# Patient Record
Sex: Female | Born: 1950 | Race: Black or African American | Hispanic: No | Marital: Married | Smoking: Former smoker
Health system: Southern US, Community
[De-identification: ages and names within clinical notes are randomized; demographics above are authoritative.]

## PROBLEM LIST (undated history)

## (undated) DIAGNOSIS — K5790 Diverticulosis of intestine, part unspecified, without perforation or abscess without bleeding: Secondary | ICD-10-CM

## (undated) DIAGNOSIS — I341 Nonrheumatic mitral (valve) prolapse: Secondary | ICD-10-CM

## (undated) DIAGNOSIS — E785 Hyperlipidemia, unspecified: Secondary | ICD-10-CM

## (undated) DIAGNOSIS — E669 Obesity, unspecified: Secondary | ICD-10-CM

## (undated) HISTORY — PX: TONSILLECTOMY: SUR1361

## (undated) HISTORY — DX: Obesity, unspecified: E66.9

## (undated) HISTORY — DX: Nonrheumatic mitral (valve) prolapse: I34.1

## (undated) HISTORY — DX: Hyperlipidemia, unspecified: E78.5

## (undated) HISTORY — DX: Diverticulosis of intestine, part unspecified, without perforation or abscess without bleeding: K57.90

---
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
## 1992-09-24 HISTORY — PX: CHOLECYSTECTOMY: SHX55

## 1998-11-23 ENCOUNTER — Other Ambulatory Visit: Admission: RE | Admit: 1998-11-23 | Discharge: 1998-11-23 | Payer: Self-pay | Admitting: Obstetrics and Gynecology

## 1998-11-29 ENCOUNTER — Encounter: Payer: Self-pay | Admitting: Emergency Medicine

## 1998-11-29 ENCOUNTER — Emergency Department (HOSPITAL_COMMUNITY): Admission: EM | Admit: 1998-11-29 | Discharge: 1998-11-29 | Payer: Self-pay | Admitting: Emergency Medicine

## 1999-11-28 ENCOUNTER — Other Ambulatory Visit: Admission: RE | Admit: 1999-11-28 | Discharge: 1999-11-28 | Payer: Self-pay | Admitting: Obstetrics and Gynecology

## 2000-02-02 ENCOUNTER — Encounter: Payer: Self-pay | Admitting: Obstetrics and Gynecology

## 2000-02-02 ENCOUNTER — Encounter: Admission: RE | Admit: 2000-02-02 | Discharge: 2000-02-02 | Payer: Self-pay | Admitting: Obstetrics and Gynecology

## 2000-07-23 ENCOUNTER — Encounter (INDEPENDENT_AMBULATORY_CARE_PROVIDER_SITE_OTHER): Payer: Self-pay

## 2000-07-23 ENCOUNTER — Other Ambulatory Visit: Admission: RE | Admit: 2000-07-23 | Discharge: 2000-07-23 | Payer: Self-pay | Admitting: Gastroenterology

## 2000-11-29 ENCOUNTER — Other Ambulatory Visit: Admission: RE | Admit: 2000-11-29 | Discharge: 2000-11-29 | Payer: Self-pay | Admitting: Obstetrics and Gynecology

## 2001-05-20 ENCOUNTER — Encounter: Payer: Self-pay | Admitting: Orthopedic Surgery

## 2001-05-20 ENCOUNTER — Encounter: Admission: RE | Admit: 2001-05-20 | Discharge: 2001-05-20 | Payer: Self-pay | Admitting: Orthopedic Surgery

## 2003-05-24 ENCOUNTER — Encounter: Admission: RE | Admit: 2003-05-24 | Discharge: 2003-05-24 | Payer: Self-pay | Admitting: Internal Medicine

## 2003-11-03 ENCOUNTER — Emergency Department (HOSPITAL_COMMUNITY): Admission: EM | Admit: 2003-11-03 | Discharge: 2003-11-03 | Payer: Self-pay | Admitting: Emergency Medicine

## 2004-09-19 ENCOUNTER — Encounter: Admission: RE | Admit: 2004-09-19 | Discharge: 2004-09-19 | Payer: Self-pay | Admitting: Obstetrics and Gynecology

## 2004-12-14 ENCOUNTER — Encounter: Admission: RE | Admit: 2004-12-14 | Discharge: 2004-12-14 | Payer: Self-pay | Admitting: Orthopedic Surgery

## 2005-05-29 ENCOUNTER — Ambulatory Visit: Payer: Self-pay | Admitting: Internal Medicine

## 2006-03-28 ENCOUNTER — Encounter: Admission: RE | Admit: 2006-03-28 | Discharge: 2006-03-28 | Payer: Self-pay | Admitting: Internal Medicine

## 2007-04-08 ENCOUNTER — Encounter: Admission: RE | Admit: 2007-04-08 | Discharge: 2007-04-08 | Payer: Self-pay | Admitting: Internal Medicine

## 2007-04-17 ENCOUNTER — Ambulatory Visit: Payer: Self-pay | Admitting: Internal Medicine

## 2007-05-16 ENCOUNTER — Ambulatory Visit: Payer: Self-pay | Admitting: Internal Medicine

## 2007-05-16 LAB — CONVERTED CEMR LAB
Cholesterol: 192 mg/dL (ref 0–200)
LDL Cholesterol: 124 mg/dL — ABNORMAL HIGH (ref 0–99)
TSH: 3.16 microintl units/mL (ref 0.35–5.50)
Triglycerides: 87 mg/dL (ref 0–149)
VLDL: 17 mg/dL (ref 0–40)
Vit D, 1,25-Dihydroxy: 6 — ABNORMAL LOW (ref 20–57)

## 2007-06-11 ENCOUNTER — Ambulatory Visit: Payer: Self-pay | Admitting: Internal Medicine

## 2007-11-14 ENCOUNTER — Ambulatory Visit: Payer: Self-pay | Admitting: Internal Medicine

## 2007-11-14 ENCOUNTER — Ambulatory Visit (HOSPITAL_COMMUNITY): Admission: RE | Admit: 2007-11-14 | Discharge: 2007-11-14 | Payer: Self-pay | Admitting: Internal Medicine

## 2007-11-14 ENCOUNTER — Telehealth (INDEPENDENT_AMBULATORY_CARE_PROVIDER_SITE_OTHER): Payer: Self-pay | Admitting: *Deleted

## 2007-11-14 DIAGNOSIS — K5730 Diverticulosis of large intestine without perforation or abscess without bleeding: Secondary | ICD-10-CM | POA: Insufficient documentation

## 2007-11-14 DIAGNOSIS — E785 Hyperlipidemia, unspecified: Secondary | ICD-10-CM | POA: Insufficient documentation

## 2007-11-14 DIAGNOSIS — Z8679 Personal history of other diseases of the circulatory system: Secondary | ICD-10-CM | POA: Insufficient documentation

## 2007-11-25 ENCOUNTER — Telehealth (INDEPENDENT_AMBULATORY_CARE_PROVIDER_SITE_OTHER): Payer: Self-pay | Admitting: *Deleted

## 2007-11-25 LAB — CONVERTED CEMR LAB: Vit D, 1,25-Dihydroxy: 24 — ABNORMAL LOW (ref 30–89)

## 2007-11-26 ENCOUNTER — Encounter: Payer: Self-pay | Admitting: Internal Medicine

## 2007-12-05 ENCOUNTER — Ambulatory Visit: Payer: Self-pay | Admitting: Gastroenterology

## 2007-12-19 ENCOUNTER — Ambulatory Visit: Payer: Self-pay | Admitting: Gastroenterology

## 2008-04-12 ENCOUNTER — Encounter: Admission: RE | Admit: 2008-04-12 | Discharge: 2008-04-12 | Payer: Self-pay | Admitting: Internal Medicine

## 2008-09-24 HISTORY — PX: CARPAL TUNNEL RELEASE: SHX101

## 2008-12-23 ENCOUNTER — Ambulatory Visit: Payer: Self-pay | Admitting: Internal Medicine

## 2008-12-29 LAB — CONVERTED CEMR LAB
Basophils Relative: 0.9 % (ref 0.0–3.0)
CRP, High Sensitivity: 2 (ref 0.00–5.00)
Calcium: 9.2 mg/dL (ref 8.4–10.5)
Cholesterol: 182 mg/dL (ref 0–200)
HCT: 38.3 % (ref 36.0–46.0)
Hemoglobin: 12 g/dL (ref 12.0–15.0)
Lymphocytes Relative: 40 % (ref 12.0–46.0)
MCHC: 31.3 g/dL (ref 30.0–36.0)
Neutrophils Relative %: 45.4 % (ref 43.0–77.0)
Platelets: 205 10*3/uL (ref 150.0–400.0)
RBC: 5.25 M/uL — ABNORMAL HIGH (ref 3.87–5.11)
RDW: 13.9 % (ref 11.5–14.6)
Sodium: 139 meq/L (ref 135–145)
Triglycerides: 84 mg/dL (ref 0.0–149.0)
VLDL: 16.8 mg/dL (ref 0.0–40.0)
WBC: 3.8 10*3/uL — ABNORMAL LOW (ref 4.5–10.5)

## 2009-03-30 ENCOUNTER — Ambulatory Visit: Payer: Self-pay | Admitting: Internal Medicine

## 2009-03-30 DIAGNOSIS — D568 Other thalassemias: Secondary | ICD-10-CM | POA: Insufficient documentation

## 2009-03-30 DIAGNOSIS — E559 Vitamin D deficiency, unspecified: Secondary | ICD-10-CM

## 2009-03-31 ENCOUNTER — Encounter: Payer: Self-pay | Admitting: Internal Medicine

## 2009-04-05 LAB — CONVERTED CEMR LAB
Hgb A2 Quant: 2.2 % (ref 2.2–3.2)
Hgb A: 97.8 % (ref 96.8–97.8)
Hgb S Quant: 0 % (ref 0.0–0.0)
Vit D, 25-Hydroxy: 31 ng/mL (ref 30–89)

## 2009-04-27 ENCOUNTER — Encounter: Admission: RE | Admit: 2009-04-27 | Discharge: 2009-04-27 | Payer: Self-pay | Admitting: Internal Medicine

## 2009-07-05 ENCOUNTER — Ambulatory Visit: Payer: Self-pay | Admitting: Internal Medicine

## 2009-07-05 DIAGNOSIS — G5600 Carpal tunnel syndrome, unspecified upper limb: Secondary | ICD-10-CM

## 2009-07-06 ENCOUNTER — Encounter: Payer: Self-pay | Admitting: Internal Medicine

## 2009-07-07 ENCOUNTER — Telehealth (INDEPENDENT_AMBULATORY_CARE_PROVIDER_SITE_OTHER): Payer: Self-pay | Admitting: *Deleted

## 2009-07-08 LAB — CONVERTED CEMR LAB
Basophils Relative: 0.6 % (ref 0.0–3.0)
Eosinophils Relative: 1.9 % (ref 0.0–5.0)
HCT: 39.1 % (ref 36.0–46.0)
Lymphocytes Relative: 39.4 % (ref 12.0–46.0)
MCHC: 32.4 g/dL (ref 30.0–36.0)
Monocytes Absolute: 0.6 10*3/uL (ref 0.1–1.0)
Monocytes Relative: 15 % — ABNORMAL HIGH (ref 3.0–12.0)
Platelets: 232 10*3/uL (ref 150.0–400.0)
RBC: 5.34 M/uL — ABNORMAL HIGH (ref 3.87–5.11)
RDW: 13.8 % (ref 11.5–14.6)
Transferrin: 246.8 mg/dL (ref 212.0–360.0)

## 2009-07-21 ENCOUNTER — Ambulatory Visit (HOSPITAL_BASED_OUTPATIENT_CLINIC_OR_DEPARTMENT_OTHER): Admission: RE | Admit: 2009-07-21 | Discharge: 2009-07-21 | Payer: Self-pay | Admitting: Orthopedic Surgery

## 2009-07-29 ENCOUNTER — Encounter: Payer: Self-pay | Admitting: Internal Medicine

## 2009-08-31 ENCOUNTER — Encounter: Payer: Self-pay | Admitting: Internal Medicine

## 2010-04-18 ENCOUNTER — Ambulatory Visit: Payer: Self-pay | Admitting: Internal Medicine

## 2010-04-19 ENCOUNTER — Encounter: Payer: Self-pay | Admitting: Internal Medicine

## 2010-04-19 LAB — CONVERTED CEMR LAB
ALT: 22 units/L (ref 0–35)
AST: 22 units/L (ref 0–37)
Alkaline Phosphatase: 78 units/L (ref 39–117)
BUN: 9 mg/dL (ref 6–23)
Basophils Absolute: 0 10*3/uL (ref 0.0–0.1)
Basophils Relative: 0.4 % (ref 0.0–3.0)
Bilirubin Urine: NEGATIVE
CO2: 27 meq/L (ref 19–32)
Chloride: 101 meq/L (ref 96–112)
GFR calc non Af Amer: 102.81 mL/min (ref >60)
Glucose, Bld: 112 mg/dL — ABNORMAL HIGH (ref 70–99)
Ketones, ur: NEGATIVE mg/dL
LDL Cholesterol: 106 mg/dL — ABNORMAL HIGH (ref 0–99)
Leukocytes, UA: NEGATIVE
Lymphocytes Relative: 33.8 % (ref 12.0–46.0)
MCV: 72.6 fL — ABNORMAL LOW (ref 78.0–100.0)
Neutro Abs: 2.3 10*3/uL (ref 1.4–7.7)
Neutrophils Relative %: 49.3 % (ref 43.0–77.0)
Platelets: 246 10*3/uL (ref 150.0–400.0)
Potassium: 4.5 meq/L (ref 3.5–5.1)
Sodium: 136 meq/L (ref 135–145)
Total Bilirubin: 0.5 mg/dL (ref 0.3–1.2)
Total CHOL/HDL Ratio: 4
Triglycerides: 132 mg/dL (ref 0.0–149.0)
Urine Glucose: NEGATIVE mg/dL
Urobilinogen, UA: 0.2 (ref 0.0–1.0)
WBC: 4.7 10*3/uL (ref 4.5–10.5)

## 2010-04-20 LAB — CONVERTED CEMR LAB: Vit D, 25-Hydroxy: 22 ng/mL — ABNORMAL LOW (ref 30–89)

## 2010-05-01 ENCOUNTER — Encounter: Admission: RE | Admit: 2010-05-01 | Discharge: 2010-05-01 | Payer: Self-pay | Admitting: Internal Medicine

## 2010-05-01 IMAGING — MG MM DIGITAL SCREENING
4 series · 4 of 4 positions shown · non-contrast
Comparison: none

DG SCREEN MAMMOGRAM BILATERAL
Bilateral CC and MLO view(s) were taken.

DIGITAL SCREENING MAMMOGRAM WITH CAD:
The breast tissue is heterogeneously dense.  No masses or malignant type calcifications are 
identified.  Compared with prior studies.
Images were processed with CAD.

[R CC]
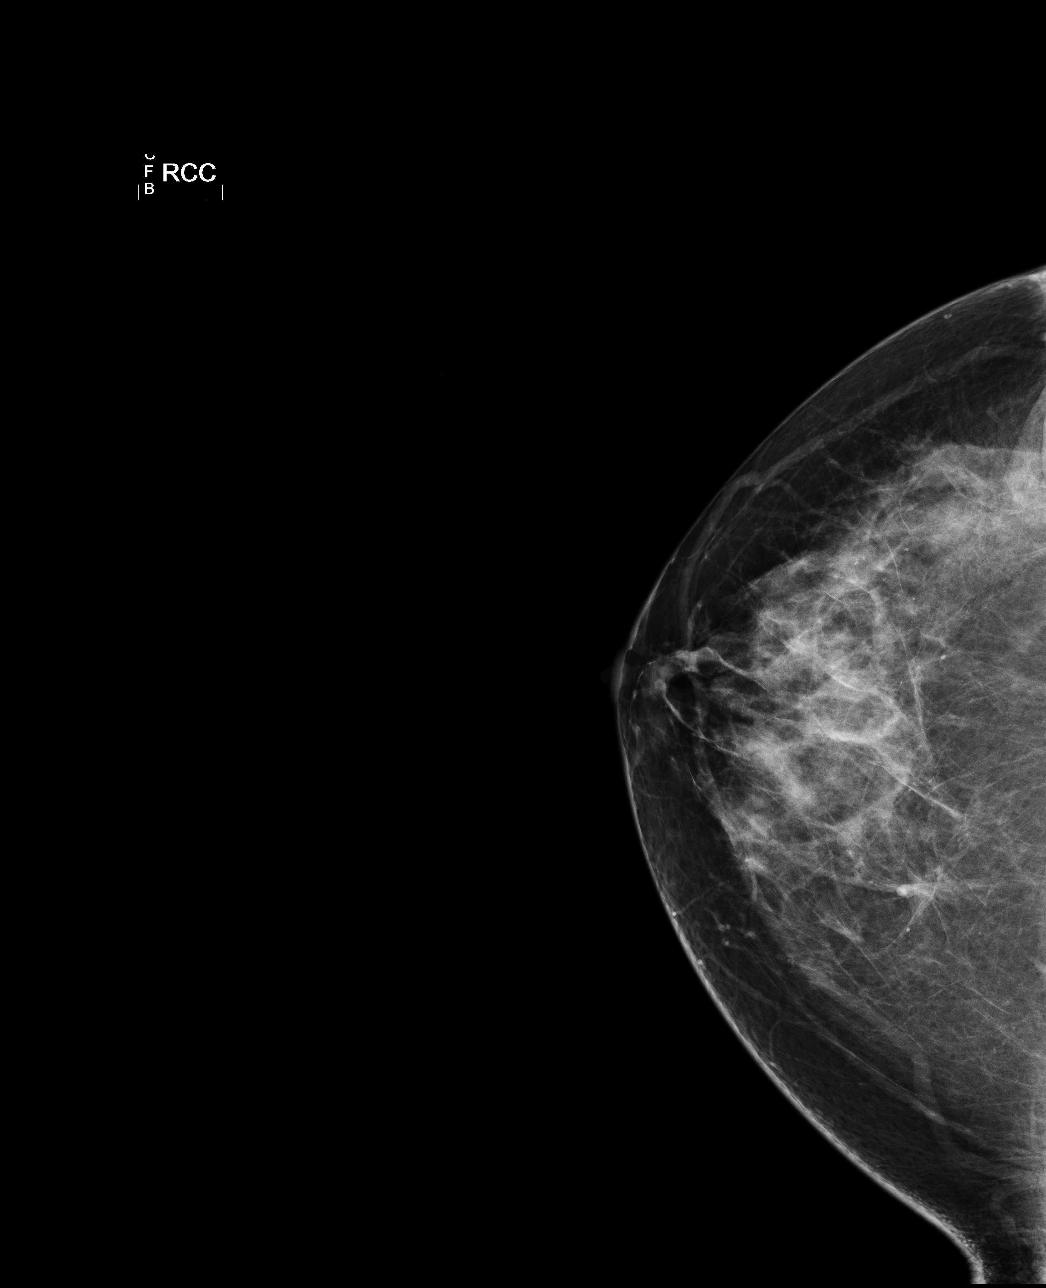

[L CC]
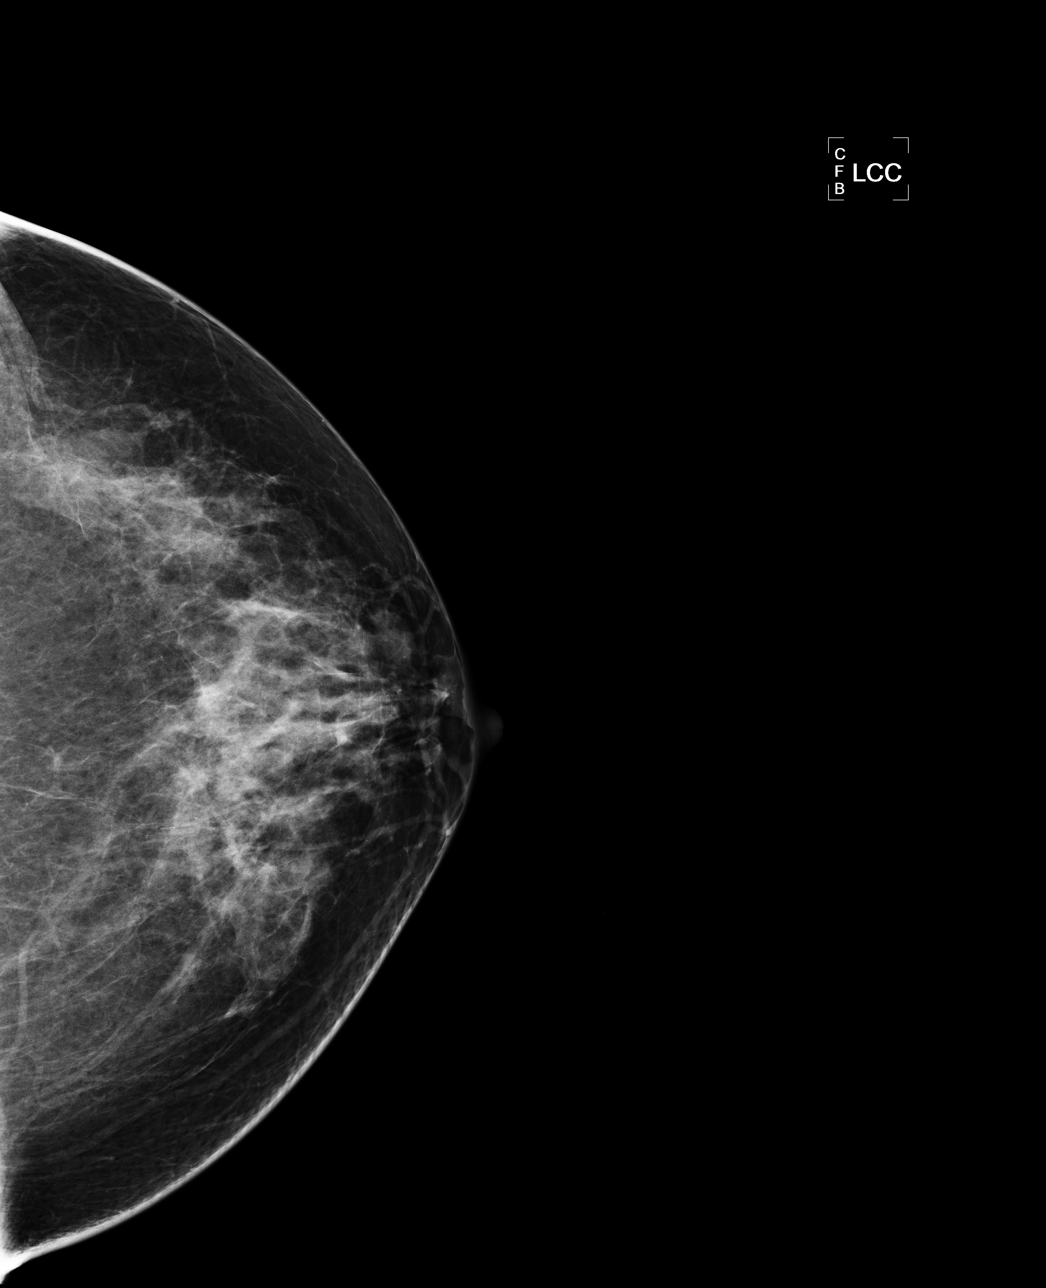

[L MLO]
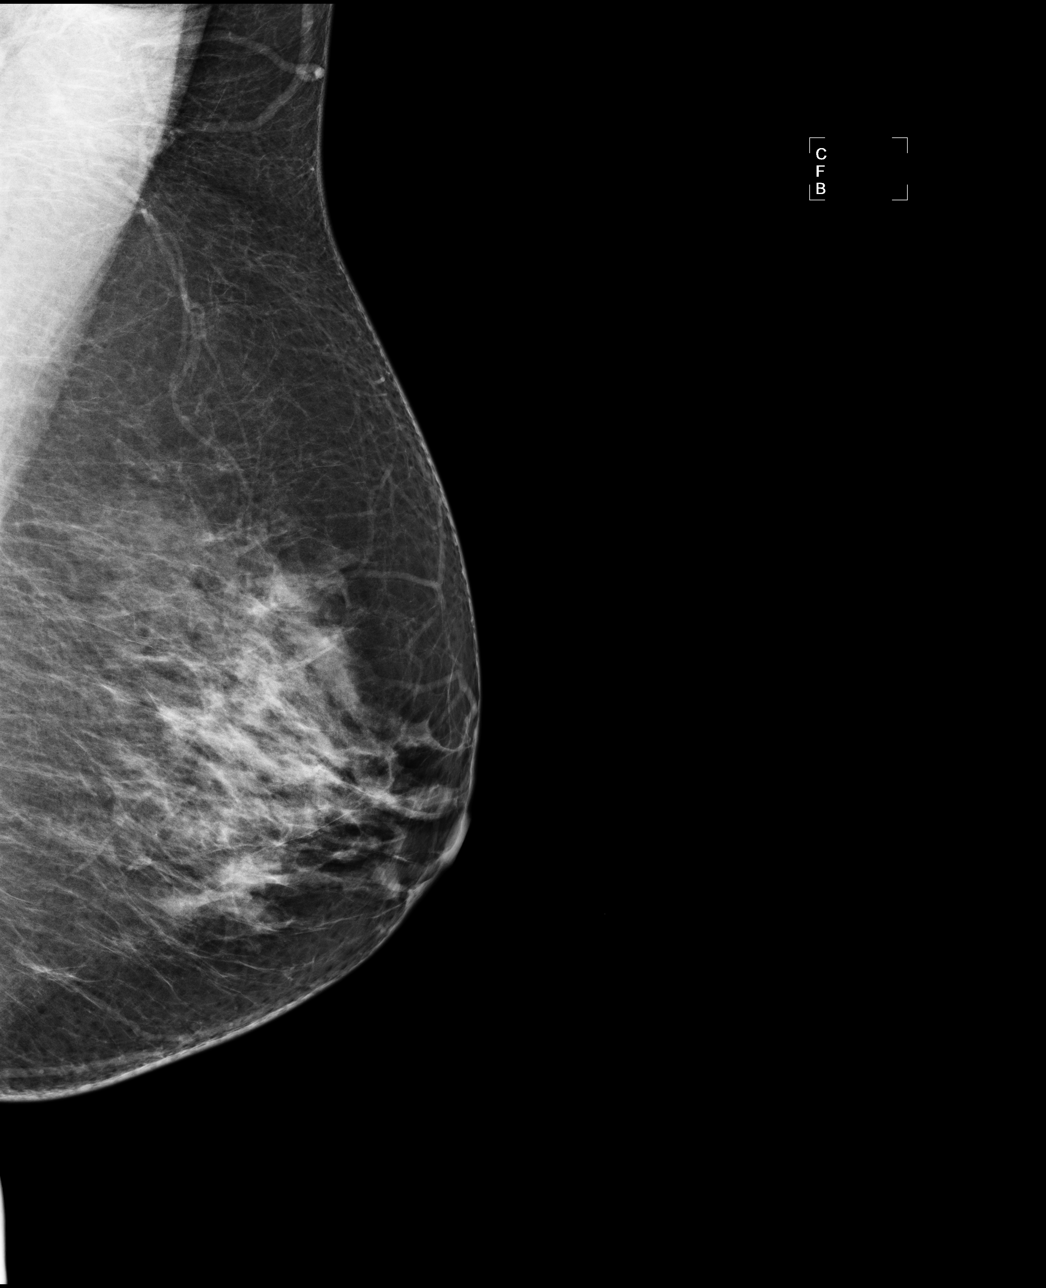

[R MLO]
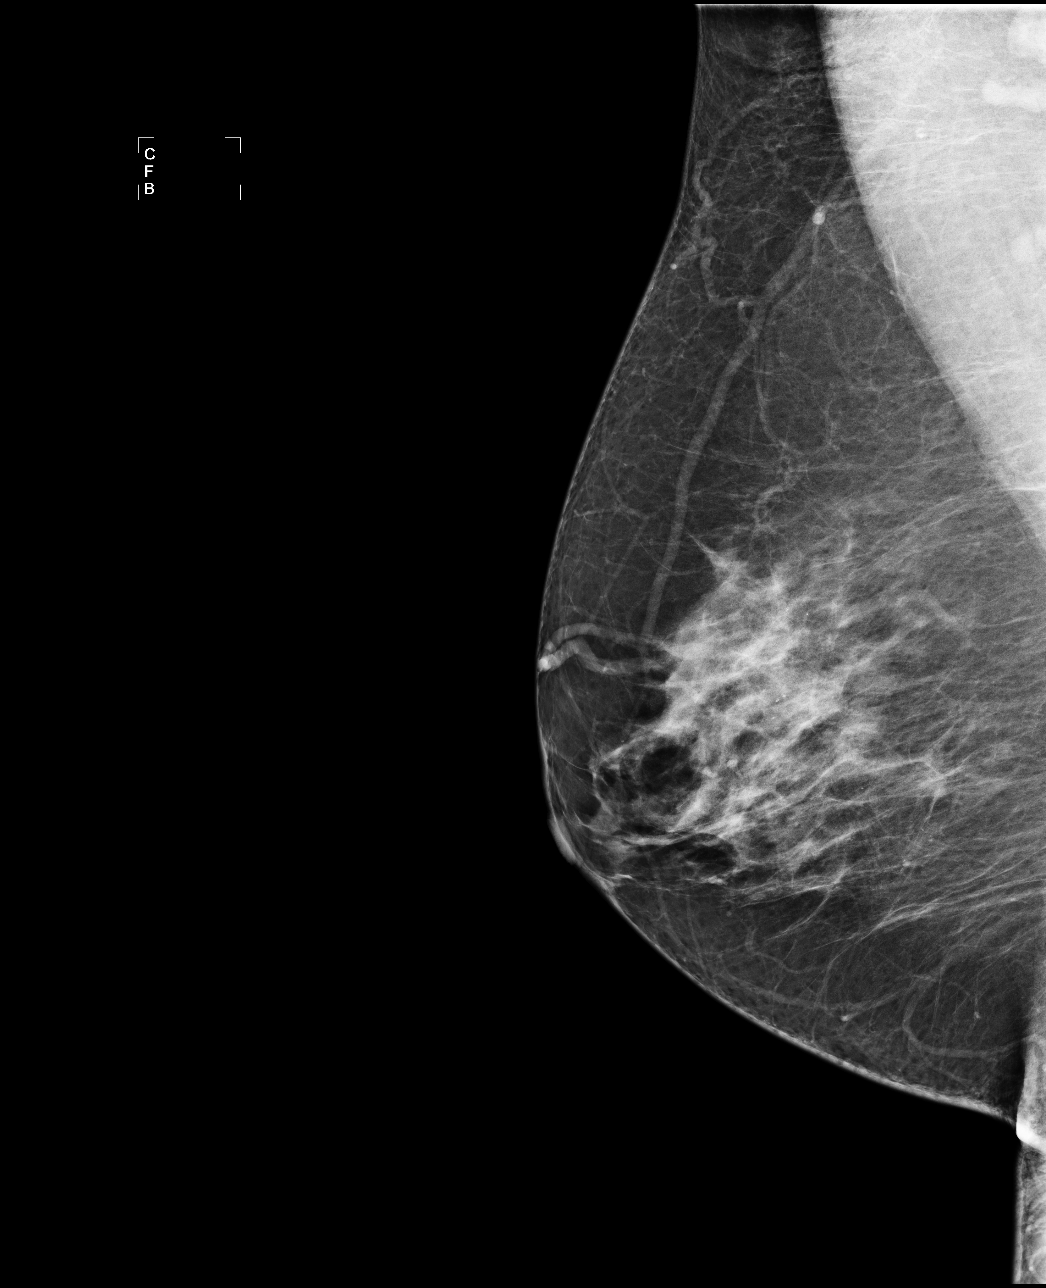

[4 of 4 positions shown; findings below may reference images not displayed]

IMPRESSION: No specific mammographic evidence of malignancy.  Next screening mammogram is recommended in one 
year.

A result letter of this screening mammogram will be mailed directly to the patient.

ASSESSMENT: Negative - BI-RADS 1

Screening mammogram in 1 year.
,

## 2010-06-12 ENCOUNTER — Encounter: Payer: Self-pay | Admitting: Internal Medicine

## 2010-06-12 ENCOUNTER — Ambulatory Visit: Payer: Self-pay | Admitting: Internal Medicine

## 2010-10-14 ENCOUNTER — Encounter: Payer: Self-pay | Admitting: Obstetrics and Gynecology

## 2010-10-26 NOTE — Miscellaneous (Signed)
Summary: BONE DENSITY  Clinical Lists Changes  Orders: Added new Test order of T-Bone Densitometry (77080) - Signed Added new Test order of T-Lumbar Vertebral Assessment (77082) - Signed 

## 2010-10-26 NOTE — Assessment & Plan Note (Signed)
Summary: Primay svc/ cpx   Primary Provider/Referring Provider:  Sherene Sires  CC:  cpx fasting.  History of Present Illness: 64  yobf quit smoking around 1985 with h/o mod obesity,  hyperlipidemia  December 23, 2008 ov new onset left jaw pain x 4 weeks waxes and wanes but def worse x 4 days and now pain on chewing, no ear c/o's or sinus c/os, not worse walking.  imp was ? tmj, try otc nsaids with dental f/u > felt was not tmj but dental in origin.  March 30, 2009 cpx no major issues, has finished vit d rx.   rec wt loss to target < 190  July 05, 2009 ov no luck with wt loss.  now with progressive numbness indolent onset right hand numb x 1 month.  all fingers, no neck pain. works as [employer] operational officer.  dx CTS, Sypher surgery 06/2009 better  April 18, 2010 cpx ex 45 min 2x weekly without sob. Pt denies any significant sore throat, dysphagia, itching, sneezing,  nasal congestion or excess secretions,  fever, chills, sweats, unintended wt loss, pleuritic or exertional cp, hempoptysis, change in activity tolerance  orthopnea pnd or leg swelling   Current Medications (verified): 1)  None  Allergies (verified): No Known Drug Allergies  Past History:  Past Medical History: Hyperlipidemia    - Target < 160 (no additional risk factors) Mild Obesity    - Target wt  =  190  for BMI < 30,  < 171 ideal Health Maintenance....................................................................Marland KitchenWert     - Td  2/05     - CPX April 18, 2010      - DEXA ordered April 18, 2010  MVP,  needs dental prophylaxis     - See Echo 03/12/95 Diverticulosis       - Colonoscopy 12/19/2007  Past Surgical History: s/p cholecystectomy 1994 s/p partial hysterectomy 1970's s/p carpal tunnel surgery Right hand 06/2009  Family History: Cancer mother ? cx brothers and sisters all healthy,  only one full (sister) unsure of father's   Vital Signs:  Patient profile:   60 year old female Height:      68 inches Weight:      196.50  pounds BMI:     29.99 O2 Sat:      97 % on Room air Temp:     97.4 degrees F oral Pulse rate:   90 / minute BP sitting:   118 / 72  (left arm) Cuff size:   large  Vitals Entered ByVernie Murders (April 18, 2010 9:00 AM)  O2 Flow:  Room air  Physical Exam  Additional Exam:  wt   196 December 23, 2008 > 198 March 30, 2009 > 200 July 05, 2009 > 196 April 18, 2010  Ambulatory healthy appearing in no acute distress.  HEENT: upper dentures, nl  turbinates, and orophanx. Nl external ear canals, no otitis, no tenderness over Left TMJ and no popping Neck without JVD/Nodes/TM, nl carotids with no bruits Lungs clear to Bennett and P bilaterally without cough on insp or exp maneuvers RRR no s3 or murmur or increase in P2 no edema  decreased sym pulses both feet Abd soft and benign with nl excursion in the supine position. No bruits or organomegaly Ext warm without calf tenderness, cyanosis clubbing/ minimal decrease pulses both feet sym MS  FROM neck, shouders, hips and knees, nl gait NEURO alert and appropriate, no deficits     Cholesterol  183 mg/dL                   1-610     ATP III Classification            Desirable:  < 200 mg/dL                    Borderline High:  200 - 239 mg/dL               High:  > = 240 mg/dL   Triglycerides             132.0 mg/dL                 9.6-045.4     Normal:  <150 mg/dL     Borderline High:  098 - 199 mg/dL   HDL                       11.91 mg/dL                 >47.82   VLDL Cholesterol          26.4 mg/dL                  9.5-62.1   LDL Cholesterol      [H]  308 mg/dL                   6-57  CHO/HDL Ratio:  CHD Risk                             4                    Men          Women     1/2 Average Risk     3.4          3.3     Average Risk          5.0          4.4     2X Average Risk          9.6          7.1     3X Average Risk          15.0          11.0                           Tests: (2) BMP (METABOL)   Sodium                     136 mEq/L                   135-145   Potassium                 4.5 mEq/L                   3.5-5.1   Chloride                  101 mEq/L                   96-112   Carbon Dioxide            27 mEq/L  19-32   Glucose              [H]  112 mg/dL                   16-10   BUN                       9 mg/dL                     9-60   Creatinine                0.7 mg/dL                   4.5-4.0   Calcium                   9.3 mg/dL                   9.8-11.9   GFR                       102.81 mL/min               >60  Tests: (3) CBC Platelet w/Diff (CBCD)   White Cell Count          4.7 K/uL                    4.5-10.5   Red Cell Count       [H]  5.33 Mil/uL                 3.87-5.11     Rechecked and verified result.   Hemoglobin                12.3 g/dL                   14.7-82.9   Hematocrit                38.6 %                      36.0-46.0   MCV                  [L]  72.6 fl                     78.0-100.0   MCHC                      31.8 g/dL                   56.2-13.0   RDW                  [H]  15.6 %                      11.5-14.6   Platelet Count            246.0 K/uL                  150.0-400.0   Neutrophil %              49.3 %                      43.0-77.0   Lymphocyte %  33.8 %                      12.0-46.0   Monocyte %           [H]  14.6 %                      3.0-12.0   Eosinophils%              1.9 %                       0.0-5.0   Basophils %               0.4 %                       0.0-3.0   Neutrophill Absolute      2.3 K/uL                    1.4-7.7   Lymphocyte Absolute       1.6 K/uL                    0.7-4.0   Monocyte Absolute         0.7 K/uL                    0.1-1.0  Eosinophils, Absolute                             0.1 K/uL                    0.0-0.7   Basophils Absolute        0.0 K/uL                    0.0-0.1  Tests: (4) Hepatic/Liver Function Panel (HEPATIC)   Total Bilirubin           0.5 mg/dL                    9.5-6.2   Direct Bilirubin          0.1 mg/dL                   1.3-0.8   Alkaline Phosphatase      78 U/L                      39-117   AST                       22 U/L                      0-37   ALT                       22 U/L                      0-35   Total Protein             8.1 g/dL                    6.5-7.8   Albumin                   4.2 g/dL  3.5-5.2  Tests: (5) TSH (TSH)   FastTSH                   3.54 uIU/mL                 0.35-5.50  Tests: (6) UDip Only (UDIP)   Color                     LT. YELLOW       RANGE:  Yellow;Lt. Yellow   Clarity                   CLEAR                       Clear   Specific Gravity          <=1.005                     1.000 - 1.030   Urine Ph                  6.0                         5.0-8.0   Protein                   NEGATIVE                    Negative   Urine Glucose             NEGATIVE                    Negative   Ketones                   NEGATIVE                    Negative   Urine Bilirubin           NEGATIVE                    Negative   Blood                     TRACE-LYSED                 Negative   Urobilinogen              0.2                         0.0 - 1.0   Leukocyte Esterace        NEGATIVE                    Negative   Nitrite                   NEGATIVE                    Negative  Impression & Recommendations:  Problem # 1:  HYPERLIPIDEMIA (ICD-272.4)    HDL:49.80 (12/23/2008), 50.4 (05/16/2007)  LDL:115 (12/23/2008), 124 (05/16/2007)  > 106 April 18, 2010   Chol:182 (12/23/2008), 192 (05/16/2007)  Trig:84.0 (12/23/2008), 87 (05/16/2007)  Problem # 2:  CARPAL TUNNEL SYNDROME (ICD-354.0) resoved p hand surgery by Sypher  Other Orders: EKG w/ Interpretation (93000) TLB-Lipid Panel (80061-LIPID) TLB-BMP (Basic Metabolic Panel-BMET) (  80048-METABOL) TLB-CBC Platelet - w/Differential (85025-CBCD) TLB-Hepatic/Liver Function Pnl (80076-HEPATIC) TLB-TSH (Thyroid Stimulating Hormone)  (84443-TSH) TLB-Udip ONLY (81003-UDIP) T-Vitamin D (25-Hydroxy) (16109-60454) Misc. Referral (Misc. Ref) Est. Patient 40-64 years (09811)  Patient Instructions: 1)  See Patient Care Coordinator before leaving for for DEXA 2)  Call [phone] for your results w/in next 3 days - if there's something important  I feel you need to know,  I'll be in touch with you directly.  3)  Return yearly, sooner if needed   CardioPerfect ECG  ID: [id number] Patient: Sandy Bennett, Sandy Bennett DOB: 12-22-1950 Age: 60 Years Old Sex: Female Race: Black Height: 68 Weight: 196.50 Status: Unconfirmed Past Medical History:  Hyperlipidemia    - Target < 160 (no additional risk factors) Mild Obesity    - Target wt  =  190  for BMI < 30,  < 171 ideal Health Maintenance...................................................................Marland KitchenWert     - Td  2/05 MVP,  needs dental prophylaxis     - See Echo 03/12/95 Diverticulosis       - Colonoscopy 12/19/2007 Recorded: 04/18/2010 09:08 AM P/PR: 107 ms / 168 ms - Heart rate (maximum exercise) QRS: 67 QT/QTc/QTd: 367 ms / 400 ms / 69 ms - Heart rate (maximum exercise)  P/QRS/T axis: 66 deg / 66 deg / 57 deg - Heart rate (maximum exercise)  Heartrate: 79 bpm  Interpretation:   sinus rhythm   Normal ECG

## 2010-12-28 LAB — POCT HEMOGLOBIN-HEMACUE: Hemoglobin: 12.6 g/dL (ref 12.0–15.0)

## 2011-02-06 NOTE — Assessment & Plan Note (Signed)
[facility]                             PULMONARY OFFICE NOTE   NAME:Graig, KIMALA HORNE                   MRN:          [id number]  DATE:04/17/2007                            DOB:          1951-02-27    This patient has not been seen in the office in almost 2 years.  She has  moderate obesity but otherwise has been healthy and developed new onset  chest pain about a month ago that lasted several minutes at a time.  It first occurred while she was sitting.  The second occurred during  sleep.  It was associated with nausea but no sweating, syncope, dyspnea,  or radiation of the pain which was substernal.  She has undergone a  cardiac evaluation with a negative Cardiolite and ejection fraction.  She now returns having no pains at all for the last week.  She denies  any exertional chest pain, orthopnea, PND, or dysphagia.   PHYSICAL EXAMINATION:  GENERAL:  She is a pleasant, ambulatory, black  female in no acute distress.  VITAL SIGNS:  She is afebrile with normal vital signs.  HEENT:  Unremarkable.  Oropharynx is clear.  LUNGS:  Fields clear bilaterally on auscultation percussion.  HEART:  Regular rhythm without murmur, gallop, or rub.  ABDOMEN:  Soft, benign.  EXTREMITIES:  Warm without calf tenderness, cyanosis, clubbing, edema.   IMPRESSION:  Chest pain or unclear etiology.   Previously we had placed this patient on Citrucel daily which I have  asked her to continue as this may all simply be irritable bowel  syndrome.  I have advised her on a reflux diet but did not give her any  medications at this point for reflux.  We will see if the chest pain  recurs with simply diet and Citrucel.   Her chart review indicates she is delinquent for colonoscopy and did not  respond to the letter that was sent to her by our GI division.  I have  asked her to not only do this but schedule a comprehensive evaluation  for the next 3 months to reestablish with me  as primary physician and  cautioned her that in the future I would like to hear from her when she  is having problems rather than have her self refer to specialist unless  of course it is an emergency setting.     Charlaine Dalton. Sherene Sires, MD, [facility]  Electronically Signed    MBW/MedQ  DD: 04/18/2007  DT: 04/18/2007  Job #: [id number]

## 2011-02-06 NOTE — Assessment & Plan Note (Signed)
[facility]                             PULMONARY OFFICE NOTE   NAME:Sandy Bennett, Sandy Bennett                   MRN:          [id number]  DATE:05/16/2007                            DOB:          Feb 01, 1951    HISTORY OF PRESENT ILLNESS:  A 60 year old black female, remote smoker,  last seen here for regular medical follow-up in 2005 with borderline  anemia, hyperlipidemia, obesity, with a target weight of 171 pounds.  I  recommended that she pay more attention to balance issues and get  regular medical follow-up, which has not actually happened.  She comes  back now after a recent evaluation for atypical chest pain by Dr.  Alanda Amass that showed a negative Cardiolite. The pain occurred over a  month ago now and has not recurred and was described as a substernal  burning that was non-radiating that occurred nocturnally and resolved  within 30 minutes without any specific intervention.  It was not  associated with any nausea, vomiting or diarrhea.  No change in bowel or  bladder habits.  She underwent a CBC and CMP, which were reported to be  normal by Dr. Alanda Amass on May 09, 2007 as well as a chest x-ray.   She comes back today stating that she is doing fine with no recurrent  chest pain.  She says she can walk around the neighborhood but does not  do any kind of regular aerobics. She denies any exertional chest pain,  orthopnea, PND, leg swelling, fever, chills, sweats, unintended weight  loss, fatty food intolerance, or history of dyspepsia.   PAST MEDICAL HISTORY:  1. Status post cholecystectomy and partial hysterectomy.  2. Mitral valve prolapse syndrome with mild mitral regurgitation,      documented June 1996.  3. Diverticulosis by colonoscopy, 2001.  Did not keep follow-up      appointment for repeat in 2006 with letter still on the chart that      she was sent.   ALLERGIES:  NO KNOWN DRUG ALLERGIES.   MEDICATIONS:  She is taking nothing  regularly.   SOCIAL HISTORY:  She quit smoking in 1980. She has been employed in the  [employer] previously.  She denies any excess alcohol  use.   FAMILY HISTORY:  Significant in that she does not know her father's  biologic history.  Her mother had cervical cancer.  She has one full  sister and the rest half-siblings.  Nobody in her family has premature  heart disease, stroke, diabetes, or hypertension to her knowledge.   REVIEW OF SYSTEMS:  Taken in detail on the worksheet.  Negative except  as outlined above.   PHYSICAL EXAMINATION:  GENERAL:  This is a pleasant ambulatory black  female in no acute distress.  VITAL SIGNS:  Height 5 feet 8 inches, which is no change from her  regular height. Blood pressure 120/78.  HEENT:  Upper dentures.  Lower dentition is intact.  Oropharynx is  clear.  Nasal turbinates are normal.  Limited funduscopy review is  normal.  Ear canals clear bilaterally.  NECK:  Supple without cervical  adenopathy or tenderness.  Trachea is  midline.  Carotid upstrokes are brisk without bruits.  CHEST:  Completely clear bilaterally to auscultation and percussion.  CARDIAC:  Regular rate and rhythm without murmur, gallop, rub, or  displacement.  PMI absent.  BREASTS:  Without masses, nipple, or skin changes.  ABDOMEN:  Soft and benign without hepatosplenomegaly, masses, or  tenderness. Femoral pulses were palpable bilaterally, no bruits.  EXTREMITIES:  Warm without calf tenderness, cyanosis, clubbing, or  edema.  Pedal pulses were intact bilaterally.  NEUROLOGIC:  No focal deficits or pathologic __________ .  SKIN:  Examination was warm and dry.   LABORATORY DATA:  Including mammogram that I reviewed with her on April 08, 2007 that was normal.   Her CBC and CMET were reported to be normal by Dr. Alanda Amass so I  obtained a TSH, which was normal.  Lipid profile revealed an LDL of 124  with an HDL of 50 and CRP of 3.   IMPRESSION/PLAN:  1.  Atypical chest pain that probably was either related to gastritis      or gastroesophageal reflux disease but certainly does not sound      cardiac.  At the same time I am concerned that the patient does not      get enough aerobic exercise though and is putting herself at risk      of future events and also not able to improve calorie balance to a      target weight of 171 or less.   I therefore, reviewed with her calorie balance issues including regular  aerobic exercise 30 minutes three times weekly as a minimum with a  target weight of 170 pounds, to try to obtain and maintain over the next  year.  1. History of diverticulosis in the computer for recall in 2006, which      she did not complete.  I reviewed with her the letter today      including the phone number to call to schedule this procedure.  2. General health maintenance.  She was last updated for tetanus in      2005 and therefore, this is not needed.  3. She will need a bone densitometry performed this year and also      vitamin D level, which is pending.  Follow-up can be yearly, sooner      p.r.n.   I have advised the patient that if she has recurrent symptoms in the  future that we should see her here in the office, before referring  herself to specialists for evaluation.  Noting that although I am  confident that we ruled out an ischemic cardiac cause for her chest  pain, it has been so long since she has had it now and it is difficult  in retrospect to figure out what it was and that all we have done is  ruled out cardiac disease but not identify a specific cause.  If she has  recurrent pain, I have asked her to contact me for prompt evaluation.     Charlaine Dalton. Sherene Sires, MD, [facility]  Electronically Signed   MBW/MedQ  DD: 05/16/2007  DT: 05/18/2007  Job #: [id number]

## 2011-04-06 ENCOUNTER — Other Ambulatory Visit: Payer: Self-pay | Admitting: Internal Medicine

## 2011-04-06 DIAGNOSIS — Z1231 Encounter for screening mammogram for malignant neoplasm of breast: Secondary | ICD-10-CM

## 2011-05-03 ENCOUNTER — Ambulatory Visit
Admission: RE | Admit: 2011-05-03 | Discharge: 2011-05-03 | Disposition: A | Discharge status: Home / Self Care | Payer: BC Managed Care – PPO | Source: Ambulatory Visit | Attending: Internal Medicine | Admitting: Internal Medicine

## 2011-05-03 DIAGNOSIS — Z1231 Encounter for screening mammogram for malignant neoplasm of breast: Secondary | ICD-10-CM

## 2011-07-25 ENCOUNTER — Encounter: Payer: Self-pay | Admitting: Internal Medicine

## 2011-07-26 ENCOUNTER — Encounter: Payer: Self-pay | Admitting: Internal Medicine

## 2011-07-26 ENCOUNTER — Ambulatory Visit (INDEPENDENT_AMBULATORY_CARE_PROVIDER_SITE_OTHER): Payer: BC Managed Care – PPO | Admitting: Internal Medicine

## 2011-07-26 DIAGNOSIS — E559 Vitamin D deficiency, unspecified: Secondary | ICD-10-CM

## 2011-07-26 DIAGNOSIS — E785 Hyperlipidemia, unspecified: Secondary | ICD-10-CM

## 2011-07-26 DIAGNOSIS — I10 Essential (primary) hypertension: Secondary | ICD-10-CM

## 2011-07-26 NOTE — Patient Instructions (Addendum)
Be very very careful with salt in all forms  Weight control is simply a matter of calorie balance which needs to be tilted in your favor by eating less and exercising more.  To get the most out of exercise, you need to be continuously aware that you are short of breath, but never out of breath, for 30 minutes daily. As you improve, it will actually be easier for you to do the same amount of exercise  in  30 minutes so always push to the level where you are short of breath.  If this does not result in gradual weight reduction then I strongly recommend you see a nutritionist with a food diary x 2 weeks so that we can work out a negative calorie balance which is universally effective in steady weight loss programs.  Think of your calorie balance like you do your bank account where in this case you want the balance to go down so you must take in less calories than you burn up.  It's just that simple:  Hard to do, but easy to understand.  Good luck!   Please schedule a follow up office visit in 6 weeks, call sooner if needed for CPX

## 2011-07-26 NOTE — Progress Notes (Signed)
Subjective:     Patient ID: Sandy Bennett, female   DOB: [birth year], 60 y.o.   MRN: [id number]  HPI  50 yobf quit smoking around 1985 with h/o mod obesity, hyperlipidemia \  December 23, 2008 ov new onset left jaw pain x 4 weeks waxes and wanes but def worse x 4 days and now pain on chewing, no ear c/o's or sinus c/os, not worse walking. imp was ? tmj, try otc nsaids with dental f/u > felt was not tmj but dental in origin.   March 30, 2009 cpx no major issues, has finished vit d rx. rec wt loss to target < 190   July 05, 2009 ov no luck with wt loss. now with progressive numbness indolent onset right hand numb x 1 month. all fingers, no neck pain. 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 No cp/claudication, walking some with seniors, no sob    Pt denies any significant sore throat, dysphagia, itching, sneezing, nasal congestion or excess secretions, fever, chills, sweats, unintended wt loss, pleuritic or exertional cp, hempoptysis, change in activity tolerance orthopnea pnd or leg swelling     Past Medical History:  Hyperlipidemia  - Target < 160 (no additional risk factors)  Mild Obesity  - Target wt = 190 for BMI < 30, < 171 ideal  Health Maintenance....................................................................Marland KitchenWert  - Td 2/05  - CPX April 18, 2010  - DEXA  06/12/10 > wnl  MVP, needs dental prophylaxis  - See Echo 03/12/95  Diverticulosis  - Colonoscopy 12/19/2007    Past Surgical History:  s/p cholecystectomy 1994  s/p partial hysterectomy 1970's  s/p carpal tunnel surgery Right hand 06/2009    Family History:  Cancer mother ? cx  brothers and sisters all healthy, only one full (sister)  unsure of father's     Review of Systems     Objective:   Physical Exam     wt 196 December 23, 2008 > 198 March 30, 2009 > 200 July 05, 2009 > 196 April 18, 2010 > 07/26/2011 191   Ambulatory  healthy appearing in no acute distress.  HEENT: upper dentures, nl turbinates, and orophanx. Nl external ear canals, no otitis, no tenderness over Left TMJ and no popping  Neck without JVD/Nodes/TM, nl carotids with no bruits  Lungs clear to A and P bilaterally without cough on insp or exp maneuvers  RRR no s3 or murmur or increase in P2 no edema decreased sym pulses both feet  Abd soft and benign with nl excursion in the supine position. No bruits or organomegaly  Ext warm without calf tenderness, cyanosis clubbing/ minimal decrease pulses both feet sym    Assessment:          Plan:

## 2011-07-27 DIAGNOSIS — I10 Essential (primary) hypertension: Secondary | ICD-10-CM | POA: Insufficient documentation

## 2011-07-27 NOTE — Assessment & Plan Note (Signed)
Needs to return fasting for cpx

## 2011-07-27 NOTE — Assessment & Plan Note (Signed)
Advised re salt and exercise, need to return for cpx

## 2011-07-27 NOTE — Assessment & Plan Note (Signed)
reviwed standard dosing guidelines, recheck level at cpx

## 2011-09-11 ENCOUNTER — Ambulatory Visit (INDEPENDENT_AMBULATORY_CARE_PROVIDER_SITE_OTHER)
Admission: RE | Admit: 2011-09-11 | Discharge: 2011-09-11 | Disposition: A | Discharge status: Home / Self Care | Payer: BC Managed Care – PPO | Source: Ambulatory Visit | Attending: Internal Medicine | Admitting: Internal Medicine

## 2011-09-11 ENCOUNTER — Other Ambulatory Visit (INDEPENDENT_AMBULATORY_CARE_PROVIDER_SITE_OTHER): Payer: BC Managed Care – PPO

## 2011-09-11 ENCOUNTER — Ambulatory Visit (INDEPENDENT_AMBULATORY_CARE_PROVIDER_SITE_OTHER): Payer: BC Managed Care – PPO | Admitting: Internal Medicine

## 2011-09-11 ENCOUNTER — Encounter: Payer: Self-pay | Admitting: Internal Medicine

## 2011-09-11 VITALS — BP 138/90 | HR 88 | Temp 97.7°F | Ht 67.75 in | Wt 191.0 lb

## 2011-09-11 DIAGNOSIS — E559 Vitamin D deficiency, unspecified: Secondary | ICD-10-CM

## 2011-09-11 DIAGNOSIS — E785 Hyperlipidemia, unspecified: Secondary | ICD-10-CM

## 2011-09-11 DIAGNOSIS — I10 Essential (primary) hypertension: Secondary | ICD-10-CM

## 2011-09-11 DIAGNOSIS — D568 Other thalassemias: Secondary | ICD-10-CM

## 2011-09-11 DIAGNOSIS — K5730 Diverticulosis of large intestine without perforation or abscess without bleeding: Secondary | ICD-10-CM

## 2011-09-11 LAB — LIPID PANEL: Cholesterol: 167 mg/dL (ref 0–200)

## 2011-09-11 LAB — HEPATIC FUNCTION PANEL
ALT: 25 U/L (ref 0–35)
Bilirubin, Direct: 0 mg/dL (ref 0.0–0.3)
Total Bilirubin: 0.3 mg/dL (ref 0.3–1.2)
Total Protein: 8.3 g/dL (ref 6.0–8.3)

## 2011-09-11 LAB — URINALYSIS
Bilirubin Urine: NEGATIVE
Leukocytes, UA: NEGATIVE
Specific Gravity, Urine: <=1.005 (ref 1.000–1.030)
Total Protein, Urine: NEGATIVE
Urine Glucose: NEGATIVE
Urobilinogen, UA: 0.2 (ref 0.0–1.0)
pH: 6.5 (ref 5.0–8.0)

## 2011-09-11 LAB — CBC WITH DIFFERENTIAL/PLATELET
Eosinophils Absolute: 0.1 10*3/uL (ref 0.0–0.7)
HCT: 39 % (ref 36.0–46.0)
Lymphocytes Relative: 42.8 % (ref 12.0–46.0)
Lymphs Abs: 1.5 10*3/uL (ref 0.7–4.0)
MCV: 73.2 fl — ABNORMAL LOW (ref 78.0–100.0)
Monocytes Absolute: 0.4 10*3/uL (ref 0.1–1.0)
Neutro Abs: 1.5 10*3/uL (ref 1.4–7.7)
Neutrophils Relative %: 42.2 % — ABNORMAL LOW (ref 43.0–77.0)
Platelets: 239 10*3/uL (ref 150.0–400.0)
RBC: 5.33 Mil/uL — ABNORMAL HIGH (ref 3.87–5.11)

## 2011-09-11 LAB — TSH: TSH: 2.89 u[IU]/mL (ref 0.35–5.50)

## 2011-09-11 LAB — BASIC METABOLIC PANEL
Calcium: 9.5 mg/dL (ref 8.4–10.5)
Chloride: 107 mEq/L (ref 96–112)
Creatinine, Ser: 0.8 mg/dL (ref 0.4–1.2)
Sodium: 141 mEq/L (ref 135–145)

## 2011-09-11 NOTE — Assessment & Plan Note (Signed)
No clinical sequelae

## 2011-09-11 NOTE — Assessment & Plan Note (Signed)
Colonoscopy 12/19/2007

## 2011-09-11 NOTE — Assessment & Plan Note (Signed)
Recheck Vit D 

## 2011-09-11 NOTE — Assessment & Plan Note (Signed)
Marginally Adequate control on present rx, reviewed diet/ex

## 2011-09-11 NOTE — Progress Notes (Signed)
Subjective:     Patient ID: Sandy Bennett, female   DOB: 04/17/1951, 60 y.o.   MRN: [id number]  HPI  79 yobf quit smoking around 1985 with h/o mod obesity, hyperlipidemia   December 23, 2008 ov new onset left jaw pain x 4 weeks waxes and wanes but def worse x 4 days and now pain on chewing, no ear c/o's or sinus c/os, not worse walking. imp was ? tmj, try otc nsaids with dental f/u > felt was not tmj but dental in origin.   March 30, 2009 cpx no major issues, has finished vit d rx. rec wt loss to target < 190   July 05, 2009 ov no luck with wt loss. now with progressive numbness indolent onset right hand numb x 1 month. all fingers, no neck pain. 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  No change rx   09/11/2011 f/u ov/Sandy Bennett cc CPX has bp checked and always ok, beginning to do more aerobics with friend no sob/ cp.  No tia/claudication. Sleeps ok.       Allergies  No Known Drug Allergies    Past Medical History:  Hyperlipidemia  - Target < 160 (no additional risk factors)  Mild Obesity  - Target wt = 190 for BMI < 30, < 171 ideal  Health Maintenance....................................................................Marland KitchenWert  - Td 10/2003 - CPX 09/11/2011  - DEXA wnl  06/12/2010 - [facility], needs dental prophylaxis  - See Echo 03/12/95  Diverticulosis  - Colonoscopy 12/19/2007   Past Surgical History:  s/p cholecystectomy 1994  s/p partial hysterectomy 1970's benign  s/p carpal tunnel surgery Right hand 06/2009    Family History:  Cancer mother ? cx  brothers and sisters all healthy, only one full (sister) - she is the younger of full sister, older than others unsure of father's      Review of Systems  Constitutional: Negative for fever, chills, diaphoresis, activity change, appetite change, fatigue and unexpected weight change.  HENT: Negative for hearing loss, ear pain, nosebleeds,  congestion, sore throat, facial swelling, rhinorrhea, sneezing, mouth sores, trouble swallowing, neck pain, neck stiffness, dental problem, voice change, postnasal drip, sinus pressure, tinnitus and ear discharge.   Eyes: Negative for photophobia, discharge, itching and visual disturbance.  Respiratory: Negative for apnea, cough, choking, chest tightness, shortness of breath, wheezing and stridor.   Cardiovascular: Negative for chest pain, palpitations and leg swelling.  Gastrointestinal: Negative for nausea, vomiting, abdominal pain, constipation, blood in stool and abdominal distention.  Genitourinary: Negative for dysuria, urgency, frequency, hematuria, flank pain, decreased urine volume and difficulty urinating.  Musculoskeletal: Negative for myalgias, back pain, joint swelling, arthralgias and gait problem.  Skin: Negative for color change, pallor and rash.  Neurological: Negative for dizziness, tremors, seizures, syncope, speech difficulty, weakness, light-headedness, numbness and headaches.  Hematological: Negative for adenopathy. Does not bruise/bleed easily.  Psychiatric/Behavioral: Negative for confusion, sleep disturbance and agitation. The patient is not nervous/anxious.        Objective:   Physical Exam    wt 196 December 23, 2008 > 198 March 30, 2009 > 200 July 05, 2009 > 196 April 18, 2010 > 191 09/11/2011  Ambulatory healthy appearing in no acute distress.  HEENT: upper dentures, nl turbinates, and orophanx. Nl external ear canals, no otitis, no tenderness over Left TMJ and no popping  Neck without JVD/Nodes/TM, nl carotids with no bruits  Lungs clear to  A and P bilaterally without cough on insp or exp maneuvers  RRR no s3 or murmur or increase in P2 no edema decreased sym pulses both feet  Abd soft and benign with nl excursion in the supine position. No bruits or organomegaly  Ext warm without calf tenderness, cyanosis clubbing/ minimal decrease pulses both feet sym  MS FROM  neck, shouders, hips and knees, nl gait  NEURO alert and appropriate, no deficits  CXR  09/11/2011 :  Slight hyperinflation configuration with minimal central peribronchial thickening. No pulmonary edema, pneumonia, or pleural effusion. Slight scoliosis.    Assessment:         Plan:

## 2011-09-11 NOTE — Assessment & Plan Note (Signed)
Adequate control on present rx, reviewed  

## 2011-09-11 NOTE — Patient Instructions (Signed)
Please remember to go to the lab and x-ray department downstairs for your tests - we will call you with the results when they are available.     Weight control is simply a matter of calorie balance which needs to be tilted in your favor by eating less and exercising more.  To get the most out of exercise, you need to be continuously aware that you are short of breath, but never out of breath, for 30 minutes daily. As you improve, it will actually be easier for you to do the same amount of exercise  in  30 minutes so always push to the level where you are consistently short of breath but never out of breath.  If this does not result in gradual weight reduction then I strongly recommend you see a nutritionist with a food diary x 2 weeks so that we can work out a negative calorie balance which is universally effective in steady weight loss programs.  Think of your calorie balance like you do your bank account where in this case you want the balance to go down so you must take in less calories than you burn up.  It's just that simple:  Hard to do, but easy to understand.  Good luck!   Avoid salt and monitor blood pressure monthly  Make appt for GYN evaluation this coming year to see what needs to be done for longterm follow up   Return here yearly for physicals, call sooner if needed

## 2011-09-12 LAB — VITAMIN D 25 HYDROXY (VIT D DEFICIENCY, FRACTURES): Vit D, 25-Hydroxy: 19 ng/mL — ABNORMAL LOW (ref 30–89)

## 2011-09-13 ENCOUNTER — Telehealth: Payer: Self-pay | Admitting: Internal Medicine

## 2011-09-13 MED ORDER — ERGOCALCIFEROL 1.25 MG (50000 UT) PO CAPS: ORAL_CAPSULE | ORAL | Status: DC

## 2011-09-13 NOTE — Telephone Encounter (Signed)
Pt aware of her labs and cxr results. Pt voiced her understanding and is aware of the directions for vit d. She would like rx sent to [facility] on [address]. Rx has been sent

## 2012-02-15 ENCOUNTER — Ambulatory Visit (INDEPENDENT_AMBULATORY_CARE_PROVIDER_SITE_OTHER): Payer: BC Managed Care – PPO | Admitting: Adult Health

## 2012-02-15 ENCOUNTER — Encounter: Payer: Self-pay | Admitting: Adult Health

## 2012-02-15 VITALS — BP 120/82 | HR 96 | Temp 97.0°F | Ht 68.0 in | Wt 188.4 lb

## 2012-02-15 DIAGNOSIS — R202 Paresthesia of skin: Secondary | ICD-10-CM | POA: Insufficient documentation

## 2012-02-15 DIAGNOSIS — R209 Unspecified disturbances of skin sensation: Secondary | ICD-10-CM

## 2012-02-15 NOTE — Patient Instructions (Signed)
Avoid heavy lifting.  Alternate ice and warm heat to area As needed   Watch for signs of rash or boils, call if develop.  Tylenol As needed   Please contact office for sooner follow up if symptoms do not improve or worsen or seek emergency care  follow up Dr. Sherene Sires  After 09/10/12 for Physical

## 2012-02-15 NOTE — Progress Notes (Signed)
Subjective:     Patient ID: Sandy Bennett, female   DOB: Feb 22, 1951, 61 y.o.   MRN: [id number]  HPI 35 yobf quit smoking around 1985 with h/o mod obesity, hyperlipidemia   December 23, 2008 ov new onset left jaw pain x 4 weeks waxes and wanes but def worse x 4 days and now pain on chewing, no ear c/o's or sinus c/os, not worse walking. imp was ? tmj, try otc nsaids with dental f/u > felt was not tmj but dental in origin.   March 30, 2009 cpx no major issues, has finished vit d rx. rec wt loss to target < 190   July 05, 2009 ov no luck with wt loss. now with progressive numbness indolent onset right hand numb x 1 month. all fingers, no neck pain. 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  No change rx   09/11/2011 f/u ov/Wert cc CPX has bp checked and always ok, beginning to do more aerobics with friend no sob/ cp.  No tia/claudication. Sleeps ok.   02/15/2012 Acute OV  Complains of burning sensation under the right axilla with some itching, and the right breast x2weeks.  denies any blisters, redness or rash. No neck , shoulder or back pain  No known injury , previous episodes.  Last mammogram was nml . Due in August this year.  No breast nodules noted. Burning is mild in nature. No exertional chest pain or reflux. No palpitations.  "Feels great " , just wanted to get checked before she leaves on cruise in 2 weeks.      Allergies  No Known Drug Allergies    Past Medical History:  Hyperlipidemia  - Target < 160 (no additional risk factors)  Mild Obesity  - Target wt = 190 for BMI < 30, < 171 ideal  Health Maintenance....................................................................Marland KitchenWert  - Td 10/2003 - CPX 09/11/2011  - DEXA wnl  06/12/2010 - GYN [facility], needs dental prophylaxis  - See Echo 03/12/95  Diverticulosis  - Colonoscopy 12/19/2007   Past Surgical History:  s/p cholecystectomy 1994  s/p  partial hysterectomy 1970's benign  s/p carpal tunnel surgery Right hand 06/2009    Family History:  Cancer mother ? cx  brothers and sisters all healthy, only one full (sister) - she is the younger of full sister, older than others unsure of father's      Review of Systems  Constitutional:   No  weight loss, night sweats,  Fevers, chills, fatigue, or  lassitude.  HEENT:   No headaches,  Difficulty swallowing,  Tooth/dental problems, or  Sore throat,                No sneezing, itching, ear ache, nasal congestion, post nasal drip,   CV:  No chest pain,  Orthopnea, PND, swelling in lower extremities, anasarca, dizziness, palpitations, syncope.   GI  No heartburn, indigestion, abdominal pain, nausea, vomiting, diarrhea, change in bowel habits, loss of appetite, bloody stools.   Resp: No shortness of breath with exertion or at rest.  No excess mucus, no productive cough,  No non-productive cough,  No coughing up of blood.  No change in color of mucus.  No wheezing.  No chest wall deformity  Skin: no rash or lesions.  GU: no dysuria, change in color of urine, no urgency or frequency.  No flank pain, no hematuria   MS:  No joint pain  or swelling.  No decreased range of motion.  No back pain.  Psych:  No change in mood or affect. No depression or anxiety.  No memory loss.         Objective:   Physical Exam    wt 196 December 23, 2008 > 198 March 30, 2009 > 200 July 05, 2009 > 196 April 18, 2010 > 191 09/11/2011  >188 02/15/2012  Ambulatory healthy appearing in no acute distress.  HEENT: upper dentures, nl turbinates, and orophanx. Nl external ear canals Neck without JVD/Nodes/TM, nl carotids with no bruits  Lungs clear to A and P bilaterally without cough on insp or exp maneuvers  RRR no s3 or murmur or increase in P2 no edema decreased sym pulses both feet  Abd soft and benign with nl excursion in the supine position. No bruits or organomegaly  Breast: large pendulous breast w/o  nodules, dimpling or nipple discharge. No axillary adenopathy.  Ext warm without calf tenderness, cyanosis clubbing/ minimal decrease pulses both feet sym  Shoulder ROM nml , nml hand grips , neck rom nml , no reproducible pain.  Skin: intact w/o rash or lesions noted.  NEURO alert and appropriate, no deficits     Assessment:         Plan:

## 2012-02-15 NOTE — Assessment & Plan Note (Addendum)
Right sided axillary paresthesia ? Etiology >exam is unrevealing.  Advised to be on look for developing rash or boils.   Plan:  Avoid heavy lifting.  Alternate ice and warm heat to area As needed   Watch for signs of rash or boils, call if develop.  Tylenol As needed   Please contact office for sooner follow up if symptoms do not improve or worsen or seek emergency care  follow up Dr. Sherene Sires  After 09/10/12 for Physical

## 2012-03-17 ENCOUNTER — Telehealth: Payer: Self-pay | Admitting: Internal Medicine

## 2012-03-17 NOTE — Telephone Encounter (Signed)
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 Pt is wanting to know if MW will okay her coming in here to have this done. Please advise Dr. Sherene Sires, thanks

## 2012-03-18 ENCOUNTER — Ambulatory Visit (INDEPENDENT_AMBULATORY_CARE_PROVIDER_SITE_OTHER): Payer: BC Managed Care – PPO

## 2012-03-18 DIAGNOSIS — Z23 Encounter for immunization: Secondary | ICD-10-CM

## 2012-03-18 NOTE — Telephone Encounter (Signed)
Ok for IPPD

## 2012-03-18 NOTE — Telephone Encounter (Signed)
Pt is going to come in today at 11:00 to have this done.

## 2012-03-20 ENCOUNTER — Encounter: Payer: Self-pay | Admitting: *Deleted

## 2012-03-20 LAB — TB SKIN TEST: Induration: 0 mm

## 2012-03-20 NOTE — Progress Notes (Signed)
Quick Note:  Pt aware ______ 

## 2012-03-31 ENCOUNTER — Other Ambulatory Visit: Payer: Self-pay | Admitting: Internal Medicine

## 2012-03-31 DIAGNOSIS — Z1231 Encounter for screening mammogram for malignant neoplasm of breast: Secondary | ICD-10-CM

## 2012-05-05 ENCOUNTER — Ambulatory Visit: Payer: BC Managed Care – PPO

## 2012-05-13 ENCOUNTER — Ambulatory Visit
Admission: RE | Admit: 2012-05-13 | Discharge: 2012-05-13 | Disposition: A | Discharge status: Home / Self Care | Payer: BC Managed Care – PPO | Source: Ambulatory Visit | Attending: Internal Medicine | Admitting: Internal Medicine

## 2012-05-13 DIAGNOSIS — Z1231 Encounter for screening mammogram for malignant neoplasm of breast: Secondary | ICD-10-CM

## 2012-09-12 ENCOUNTER — Encounter: Payer: Self-pay | Admitting: Internal Medicine

## 2012-09-12 ENCOUNTER — Ambulatory Visit (INDEPENDENT_AMBULATORY_CARE_PROVIDER_SITE_OTHER)
Admission: RE | Admit: 2012-09-12 | Discharge: 2012-09-12 | Disposition: A | Discharge status: Home / Self Care | Payer: BC Managed Care – PPO | Source: Ambulatory Visit | Attending: Internal Medicine | Admitting: Internal Medicine

## 2012-09-12 ENCOUNTER — Ambulatory Visit (INDEPENDENT_AMBULATORY_CARE_PROVIDER_SITE_OTHER): Payer: BC Managed Care – PPO | Admitting: Internal Medicine

## 2012-09-12 ENCOUNTER — Other Ambulatory Visit (INDEPENDENT_AMBULATORY_CARE_PROVIDER_SITE_OTHER): Payer: BC Managed Care – PPO

## 2012-09-12 VITALS — BP 120/72 | HR 77 | Temp 97.8°F | Ht 67.5 in | Wt 195.0 lb

## 2012-09-12 DIAGNOSIS — I10 Essential (primary) hypertension: Secondary | ICD-10-CM

## 2012-09-12 DIAGNOSIS — D568 Other thalassemias: Secondary | ICD-10-CM

## 2012-09-12 DIAGNOSIS — Z8679 Personal history of other diseases of the circulatory system: Secondary | ICD-10-CM

## 2012-09-12 DIAGNOSIS — E559 Vitamin D deficiency, unspecified: Secondary | ICD-10-CM

## 2012-09-12 DIAGNOSIS — E785 Hyperlipidemia, unspecified: Secondary | ICD-10-CM

## 2012-09-12 DIAGNOSIS — Z0000 Encounter for general adult medical examination without abnormal findings: Secondary | ICD-10-CM

## 2012-09-12 LAB — LIPID PANEL
Cholesterol: 177 mg/dL (ref 0–200)
HDL: 49.7 mg/dL (ref >39.00)
LDL Cholesterol: 116 mg/dL — ABNORMAL HIGH (ref 0–99)
Triglycerides: 59 mg/dL (ref 0.0–149.0)
VLDL: 11.8 mg/dL (ref 0.0–40.0)

## 2012-09-12 LAB — URINALYSIS, ROUTINE W REFLEX MICROSCOPIC
Nitrite: NEGATIVE
Total Protein, Urine: NEGATIVE
pH: 6 (ref 5.0–8.0)

## 2012-09-12 LAB — CBC WITH DIFFERENTIAL/PLATELET
Eosinophils Relative: 1.4 % (ref 0.0–5.0)
Monocytes Relative: 13.1 % — ABNORMAL HIGH (ref 3.0–12.0)
Neutrophils Relative %: 47.7 % (ref 43.0–77.0)
Platelets: 250 10*3/uL (ref 150.0–400.0)
WBC: 4.4 10*3/uL — ABNORMAL LOW (ref 4.5–10.5)

## 2012-09-12 LAB — BASIC METABOLIC PANEL
BUN: 12 mg/dL (ref 6–23)
Calcium: 9.9 mg/dL (ref 8.4–10.5)
GFR: 95.98 mL/min (ref >60.00)
Glucose, Bld: 121 mg/dL — ABNORMAL HIGH (ref 70–99)

## 2012-09-12 LAB — HEPATIC FUNCTION PANEL
Albumin: 4.2 g/dL (ref 3.5–5.2)
Total Bilirubin: 0.2 mg/dL — ABNORMAL LOW (ref 0.3–1.2)

## 2012-09-12 NOTE — Patient Instructions (Signed)
Please remember to go to the lab and x-ray department downstairs for your tests - we will call you with the results when they are available.     Please schedule a follow up visit in 12 months but call sooner if needed for cpx on return

## 2012-09-12 NOTE — Assessment & Plan Note (Addendum)
-   Target < 160 (no additional risk factors)   Lab Results  Component Value Date   CHOL 177 09/12/2012   HDL 49.70 09/12/2012   LDLCALC 116* 09/12/2012   TRIG 59.0 09/12/2012   CHOLHDL 4 09/12/2012   Adequate control on present rx, reviewed

## 2012-09-12 NOTE — Assessment & Plan Note (Signed)
Dental prophylaxis reviewed 09/12/2012

## 2012-09-12 NOTE — Assessment & Plan Note (Signed)
Adequate control on present rx, reviewed need to avoid salt and salty food, ok to treat s meds for now

## 2012-09-12 NOTE — Progress Notes (Signed)
Subjective:     Patient ID: Sandy Bennett, female   DOB: 1951-03-25    MRN: [id number]  HPI 73 yobf quit smoking around 1985 with h/o mod obesity, hyperlipidemia   December 23, 2008 ov new onset left jaw pain x 4 weeks waxes and wanes but def worse x 4 days and now pain on chewing, no ear c/o's or sinus c/os, not worse walking. imp was ? tmj, try otc nsaids with dental f/u > felt was not tmj but dental in origin.   March 30, 2009 cpx no major issues, has finished vit d rx. rec wt loss to target < 190   July 05, 2009 ov no luck with wt loss. now with progressive numbness indolent onset right hand numb x 1 month. all fingers, no neck pain. 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  No change rx   09/11/2011 f/u ov/Saverio Kader cc CPX has bp checked and always ok, beginning to do more aerobics with friend   rec Avoid salt and monitor blood pressure monthly    09/12/2012 f/u ov/Jennyfer Nickolson cc good ex tol but not able to loose wt dancing, walking ? Gym next. No cp, tia, claudication  Sleeping ok without nocturnal  or early am exacerbation  of respiratory  c/o's or need for noct saba. Also denies any obvious fluctuation of symptoms with weather or environmental changes or other aggravating or alleviating factors except as outlined above  ROS  The following are not active complaints unless bolded sore throat, dysphagia, dental problems, itching, sneezing,  nasal congestion or excess/ purulent secretions, ear ache,   fever, chills, sweats, unintended wt loss, pleuritic or exertional cp, hemoptysis,  orthopnea pnd or leg swelling, presyncope, palpitations, heartburn, abdominal pain, anorexia, nausea, vomiting, diarrhea  or change in bowel or urinary habits, change in stools or urine, dysuria,hematuria,  rash, arthralgias, visual complaints, headache, numbness weakness or ataxia or problems with walking or coordination,  change in mood/affect or  memory.         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 cx  brothers and sisters all healthy, only one full (sister) - she is the younger of full sister, older than others unsure of father's               Objective:   Physical Exam    wt 196 December 23, 2008 > 198 March 30, 2009 > 200 July 05, 2009 > 196 April 18, 2010 > 191 09/11/2011  >188 02/15/2012 > 09/12/2012  195 Ambulatory healthy appearing in no acute distress.  HEENT: upper dentures, nl turbinates, and orophanx. Nl external ear canals Neck without JVD/Nodes/TM, nl carotids with no bruits  Lungs clear to A and P bilaterally without cough on insp or exp maneuvers  RRR no s3 or murmur or increase in P2  no edema decreased sym pulses both feet  Abd soft and benign with nl excursion in the supine position. No bruits or organomegaly  Breast: large pendulous breast w/o nodules, dimpling or nipple discharge. No axillary adenopathy.  Ext warm without calf tenderness, cyanosis clubbing/ minimal decrease pulses both feet sym  Shoulder ROM nml , nml hand grips , neck rom nml , no reproducible pain.  Skin: intact w/o rash or lesions noted.  NEURO alert and appropriate, no deficits    CXR  09/12/2012 :  Stable exam. No acute finding    Assessment:         Plan:

## 2012-09-13 NOTE — Assessment & Plan Note (Signed)
-   DEXA wnl  06/12/2010  Recheck vit d

## 2012-09-13 NOTE — Assessment & Plan Note (Signed)
Chronically microcytic s sequelae

## 2012-09-15 NOTE — Progress Notes (Signed)
Quick Note:  Spoke with pt and notified of results per Dr. Wert. Pt verbalized understanding and denied any questions.  ______ 

## 2012-11-21 ENCOUNTER — Encounter: Payer: Self-pay | Admitting: Gastroenterology

## 2012-11-24 ENCOUNTER — Telehealth: Payer: Self-pay | Admitting: Internal Medicine

## 2012-11-24 NOTE — Telephone Encounter (Signed)
Spoke with pt She wanted to let MW know that she had some stomach cramps over the w/e She states that the pain was sharp and intermittent No constipation, diarrhea, nausea, vomiting, f/c/s, and no changes in her diet  She states that her that she is no longer having the pain, but wanted to let MW know that this happened I advised that the office is closing due to inclement weather, and she should seem emergent care should her pain return Pt verbalized understanding and states nothing further needed

## 2012-11-24 NOTE — Telephone Encounter (Signed)
Call and check on her and remind her Dr Russella Dar is her GI doctor and  She should probably try some maalox plus if pains come back but if not let us refer her to Dr Russella Dar

## 2012-11-25 NOTE — Telephone Encounter (Signed)
Called and spoke with pt and she stated that she is feeling much better today but will call back if she needs help with getting an appt with GI.

## 2012-11-26 ENCOUNTER — Telehealth: Payer: Self-pay | Admitting: Internal Medicine

## 2012-11-26 NOTE — Telephone Encounter (Signed)
I spoke with Sandy Bennett. I advised her she would need to go on ahead and see Dr. Russella Dar for her stomach problems 1st. She will give him a call for an appt. Nothing further was needed

## 2013-04-10 ENCOUNTER — Encounter: Payer: Self-pay | Admitting: Gastroenterology

## 2013-05-27 ENCOUNTER — Encounter: Payer: Self-pay | Admitting: Adult Health

## 2013-05-27 ENCOUNTER — Ambulatory Visit (INDEPENDENT_AMBULATORY_CARE_PROVIDER_SITE_OTHER): Payer: BC Managed Care – PPO | Admitting: Adult Health

## 2013-05-27 VITALS — BP 124/74 | HR 87 | Temp 98.0°F | Ht 68.0 in | Wt 195.8 lb

## 2013-05-27 DIAGNOSIS — H6980 Other specified disorders of Eustachian tube, unspecified ear: Secondary | ICD-10-CM

## 2013-05-27 DIAGNOSIS — H6982 Other specified disorders of Eustachian tube, left ear: Secondary | ICD-10-CM

## 2013-05-27 DIAGNOSIS — M26609 Unspecified temporomandibular joint disorder, unspecified side: Secondary | ICD-10-CM | POA: Insufficient documentation

## 2013-05-27 NOTE — Assessment & Plan Note (Signed)
Take Claritin D for next 3-4 days then as needed for sinus congestion .  Please contact office for sooner follow up if symptoms do not improve or worsen or seek emergency care   

## 2013-05-27 NOTE — Patient Instructions (Addendum)
Take Claritin D for next 3-4 days then as needed for sinus congestion .  Please contact office for sooner follow up if symptoms do not improve or worsen or seek emergency care

## 2013-05-27 NOTE — Progress Notes (Signed)
Subjective:     Patient ID: Sandy Bennett, female   DOB: [birth year]    MRN: [id number] HPI 60 yobf quit smoking around 1985 with h/o mod obesity, hyperlipidemia   December 23, 2008 ov new onset left jaw pain x 4 weeks waxes and wanes but def worse x 4 days and now pain on chewing, no ear c/o's or sinus c/os, not worse walking. imp was ? tmj, try otc nsaids with dental f/u > felt was not tmj but dental in origin.   March 30, 2009 cpx no major issues, has finished vit d rx. rec wt loss to target < 190   July 05, 2009 ov no luck with wt loss. now with progressive numbness indolent onset right hand numb x 1 month. all fingers, no neck pain. 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  No change rx   09/11/2011 f/u ov/Wert cc CPX has bp checked and always ok, beginning to do more aerobics with friend   rec Avoid salt and monitor blood pressure monthly  09/12/2012 f/u ov/Wert cc good ex tol but not able to loose wt dancing, walking ? Gym next. No cp, tia, claudication >>no changes   05/27/2013 Acute OV  Complains of left ear discomfort and congestion x3days No drainage, fever, sinus pain, headache.  No visual/speech changes. Had mild lightheadedness yesterday, resolved today.        ROS  The following are not active complaints unless bolded sore throat, dysphagia, dental problems, itching, sneezing,  nasal congestion or excess/ purulent secretions,   fever, chills, sweats, unintended wt loss, pleuritic or exertional cp, hemoptysis,  orthopnea pnd or leg swelling, presyncope, palpitations, heartburn, abdominal pain, anorexia, nausea, vomiting, diarrhea  or change in bowel or urinary habits, change in stools or urine, dysuria,hematuria,  rash, arthralgias, visual complaints, headache, numbness weakness or ataxia or problems with walking or coordination,  change in mood/affect or memory.         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 cx  brothers and sisters all healthy, only one full (sister) - she is the younger of full sister, older than others unsure of father's               Objective:   Physical Exam    wt 196 December 23, 2008 > 198 March 30, 2009 > 200 July 05, 2009 > 196 April 18, 2010 > 191 09/11/2011  >188 02/15/2012 > 09/12/2012  195>195  Ambulatory healthy appearing in no acute distress.  HEENT: upper dentures, nl turbinates, and orophanx. Nl external ear canals, no redness or drainage.  Neck without JVD/Nodes/TM, nl carotids with no bruits  Lungs clear to A and P bilaterally without cough on insp or exp maneuvers  RRR no s3 or murmur or increase in P2 no edema decreased  sym pulses both feet  Abd soft and benign with nl excursion in the supine position. No bruits or organomegaly   Ext warm without calf tenderness, cyanosis clubbing/     nml hand grips ,   Neuro : intact    CXR  09/12/2012 :  Stable exam. No acute finding    Assessment:         Plan:

## 2013-06-01 ENCOUNTER — Telehealth: Payer: Self-pay | Admitting: Internal Medicine

## 2013-06-01 ENCOUNTER — Encounter: Payer: Self-pay | Admitting: Internal Medicine

## 2013-06-01 ENCOUNTER — Ambulatory Visit (INDEPENDENT_AMBULATORY_CARE_PROVIDER_SITE_OTHER): Payer: BC Managed Care – PPO | Admitting: Internal Medicine

## 2013-06-01 VITALS — BP 136/90 | HR 111 | Temp 98.5°F | Ht 68.0 in | Wt 194.4 lb

## 2013-06-01 DIAGNOSIS — I10 Essential (primary) hypertension: Secondary | ICD-10-CM

## 2013-06-01 DIAGNOSIS — H9202 Otalgia, left ear: Secondary | ICD-10-CM

## 2013-06-01 DIAGNOSIS — H9209 Otalgia, unspecified ear: Secondary | ICD-10-CM

## 2013-06-01 MED ORDER — NEOMYCIN-POLYMYXIN-HC 1 % OT SOLN: 3.0000 [drp] | Freq: Four times a day (QID) | OTIC | Status: DC

## 2013-06-01 NOTE — Telephone Encounter (Signed)
Spoke with patient-- Patient last seen 05/27/13 for ear ache and pain Patient Instructions    Take Claritin D for next 3-4 days then as needed for sinus congestion .  Please contact office for sooner follow up if symptoms do not improve or worsen or seek emergency care    Patient states she has been taking the Claritin D as directed however the pain worsened x 3 days in which she then started on OTC ear drops. Patient states the ear drops helped relieve some of the pain Today patient states her ears ache worse than they did when she came in for her OV Patient states she is unsure if she needs an antibiotic or should she just continue with the Claritin Tammy please advise, thank you!  No Known Allergies

## 2013-06-01 NOTE — Patient Instructions (Addendum)
cortisoporin otic solution 3 drops L ear up to 4 x daily > call for ent eval if not better  Return for cpx 09/12/13

## 2013-06-01 NOTE — Progress Notes (Signed)
Subjective:     Patient ID: Sandy Bennett, female   DOB: 1951/03/14    MRN: [id number]   Brief patient profile:  62 yobf quit smoking around 1985 with h/o mod obesity, hyperlipidemia   December 23, 2008 ov new onset left jaw pain x 4 weeks waxes and wanes but def worse x 4 days and now pain on chewing, no ear c/o's or sinus c/os, not worse walking. imp was ? tmj, try otc nsaids with dental f/u > felt was not tmj but dental in origin.      July 05, 2009 ov no luck with wt loss. now with progressive numbness indolent onset right hand numb x 1 month. all fingers, no neck pain. 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  No change rx    05/27/2013 Acute OV  Complains of left ear discomfort and congestion x3 days No drainage, fever, sinus pain, headache.  No visual/speech changes. Had mild lightheadedness yesterday, resolved today. rec Take Claritin D for next 3-4 days then as needed for sinus congestion .    06/01/2013 f/u ov/Wert persistent L ear pain Chief Complaint  Patient presents with  . Acute Visit    Pt c/o ear pain for approx 1 wk- taking otc ear drops and this has helped some. Tried taking claritin, but did not help.   no nasal congestion/ cold - like symptoms, fever, sorethroat, hearing changes or vertigo, no pain when chew.  Worse lying down, better with ear drops  No obvious daytime variabilty or assoc chronic cough or cp or chest tightness, subjective wheeze overt sinus or hb symptoms. No unusual exp hx or h/o childhood pna/ asthma or knowledge of premature birth.     . Also denies any obvious fluctuation of symptoms with weather or environmental changes or other aggravating or alleviating factors except as outlined above  Current Medications, Allergies, Complete Past Medical History, Past Surgical History, Family History, and Social History were reviewed in [employer]  record.  ROS  The following are not active complaints unless bolded sore throat, dysphagia, dental problems, itching, sneezing,  nasal congestion or excess/ purulent secretions, ear ache,   fever, chills, sweats, unintended wt loss, pleuritic or exertional cp, hemoptysis,  orthopnea pnd or leg swelling, presyncope, palpitations, heartburn, abdominal pain, anorexia, nausea, vomiting, diarrhea  or change in bowel or urinary habits, change in stools or urine, dysuria,hematuria,  rash, arthralgias, visual complaints, headache, numbness weakness or ataxia or problems with walking or coordination,  change in mood/affect or memory.                   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 cx  brothers and sisters all healthy, only one full (sister) - she is the younger of full sister, older than others unsure  of father's               Objective:   Physical Exam    wt 196 December 23, 2008 > 198 March 30, 2009 > 200 July 05, 2009 > 196 April 18, 2010 > 191 09/11/2011  >188 02/15/2012 > 09/12/2012  195>     194 06/01/2013  Ambulatory healthy appearing in no acute distress.  HEENT: upper dentures, nl turbinates, and orophanx. Pain on manipulation of L ear but no redness or drainage.  Neck without JVD/Nodes/TM, nl carotids with no bruits  Lungs clear to A and P bilaterally without cough on insp or exp maneuvers   RRR no s3 or murmur or increase in P2 no edema decreased sym pulses both feet  Abd soft and benign with nl excursion in the supine position. No bruits or organomegaly   Ext warm without calf tenderness, cyanosis clubbing/     nml hand grips ,   Neuro : intact    CXR  09/12/2012 :  Stable exam. No acute finding    Assessment:

## 2013-06-01 NOTE — Telephone Encounter (Signed)
Will need to see ENT if ear pain is worse not sure what is causing  ? Dental issues or sinus congestion  Refer to ENT or can make ov with Dr. Sherene Sires  This week. Please contact office for sooner follow up if symptoms do not improve or worsen or seek emergency care

## 2013-06-01 NOTE — Telephone Encounter (Signed)
I spoke with pt and is aware of recs. She schedule appt w/ MW. Nothing further needed

## 2013-06-02 NOTE — Assessment & Plan Note (Signed)
She has h/o tmj but doesn't recognize this as similar to previous problems and no worse chewing  rx as ext otitis, ent f/u prn

## 2013-06-02 NOTE — Assessment & Plan Note (Signed)
Borderline elevated, rec reduce salt and f/u for yearly eval in Dec 2014

## 2013-06-15 ENCOUNTER — Telehealth: Payer: Self-pay | Admitting: Internal Medicine

## 2013-06-15 DIAGNOSIS — H9202 Otalgia, left ear: Secondary | ICD-10-CM

## 2013-06-15 NOTE — Telephone Encounter (Signed)
Referral has been placed to [facility]. Pt is aware and needed nothing further

## 2013-06-15 NOTE — Telephone Encounter (Signed)
Let her know sorry to hear that > Fine to refer to ENT first available for ear ache

## 2013-06-15 NOTE — Telephone Encounter (Signed)
I spoke with pt. She is requesting a referral to ENT. She reports she is still having problems with her left ear. She has eben here twice for this problem. It is very achy. Please advise Dr. Sherene Sires thanks

## 2013-06-26 ENCOUNTER — Ambulatory Visit (AMBULATORY_SURGERY_CENTER): Payer: Self-pay | Admitting: *Deleted

## 2013-06-26 ENCOUNTER — Encounter: Payer: Self-pay | Admitting: Gastroenterology

## 2013-06-26 VITALS — Ht 68.0 in | Wt 193.6 lb

## 2013-06-26 DIAGNOSIS — Z86010 Personal history of colonic polyps: Secondary | ICD-10-CM

## 2013-06-26 MED ORDER — MOVIPREP 100 G PO SOLR: 1.0000 | Freq: Once | ORAL | Status: DC

## 2013-06-26 NOTE — Progress Notes (Signed)
No egg or soy allergy. No anesthesia problems.  

## 2013-07-10 ENCOUNTER — Encounter: Payer: Self-pay | Admitting: Gastroenterology

## 2013-07-10 ENCOUNTER — Ambulatory Visit (AMBULATORY_SURGERY_CENTER): Payer: BC Managed Care – PPO | Admitting: Gastroenterology

## 2013-07-10 VITALS — BP 139/79 | HR 75 | Temp 97.3°F | Resp 20 | Ht 68.0 in | Wt 193.0 lb

## 2013-07-10 DIAGNOSIS — Z86010 Personal history of colonic polyps: Secondary | ICD-10-CM

## 2013-07-10 MED ORDER — SODIUM CHLORIDE 0.9 % IV SOLN: 500.0000 mL | INTRAVENOUS | Status: DC

## 2013-07-10 NOTE — Op Note (Signed)
[facility] [address] [place], [address]   COLONOSCOPY PROCEDURE REPORT  PATIENT: Sandy, Bennett  MR#: [id number] BIRTHDATE: 09/08/1951 , 62  yrs. old GENDER: Female ENDOSCOPIST: Meryl Dare, MD, [facility] PROCEDURE DATE:  07/10/2013 PROCEDURE:   Colonoscopy, screening First Screening Colonoscopy - Avg.  risk and is 50 yrs.  old or older - No.  Prior Negative Screening - Now for repeat screening. N/A  History of Adenoma - Now for follow-up colonoscopy & has been > or = to 3 yrs.  Yes hx of adenoma.  Has been 3 or more years since last colonoscopy.  Polyps Removed Today? No.  Recommend repeat exam, <10 yrs? Yes.  High risk (family or personal hx). ASA CLASS:   Class II INDICATIONS:Patient's personal history of adenomatous colon polyps.  MEDICATIONS: MAC sedation, administered by CRNA and propofol (Diprivan) 170mg  IV DESCRIPTION OF PROCEDURE:   After the risks benefits and alternatives of the procedure were thoroughly explained, informed consent was obtained.  A digital rectal exam revealed no abnormalities of the rectum.   The LB [id number]  endoscope was introduced through the anus and advanced to the cecum, which was identified by both the appendix and ileocecal valve. No adverse events experienced.   The quality of the prep was good, using MoviPrep  The instrument was then slowly withdrawn as the colon was fully examined.  COLON FINDINGS: Mild diverticulosis was noted in the ascending colon and transverse colon.   Moderate diverticulosis was noted in the sigmoid colon.   The colon was otherwise normal.  There was no diverticulosis, inflammation, polyps or cancers unless previously stated.  Retroflexed views revealed small internal hemorrhoids. The time to cecum=3 minutes 21 seconds.  Withdrawal time=8 minutes 33 seconds.  The scope was withdrawn and the procedure completed. COMPLICATIONS: There were no complications.  ENDOSCOPIC  IMPRESSION: 1.   Mild diverticulosis in the ascending colon and transverse colon  2.   Moderate diverticulosis in the sigmoid colon 3.   Small internal hemorrhoids  RECOMMENDATIONS: 1.  High fiber diet with liberal fluid intake. 2.  Repeat Colonoscopy in 5 years.  eSigned:  Meryl Dare, MD, [facility] 07/10/2013 11:15 AM

## 2013-07-10 NOTE — Progress Notes (Signed)
Patient did not experience any of the following events: a burn prior to discharge; a fall within the facility; wrong site/side/patient/procedure/implant event; or a hospital transfer or hospital admission upon discharge from the facility. (G8907) Patient did not have preoperative order for IV antibiotic SSI prophylaxis. (G8918)  

## 2013-07-10 NOTE — Patient Instructions (Addendum)
Impressions/recommendations:  Diverticulosis (handout given) Hemorrhoids (handout given)  High Fiber Diet (handout given)  Repeat colonoscopy in 5 years.  YOU HAD AN ENDOSCOPIC PROCEDURE TODAY AT THE [facility]: Refer to the procedure report that was given to you for any specific questions about what was found during the examination.  If the procedure report does not answer your questions, please call your gastroenterologist to clarify.  If you requested that your care partner not be given the details of your procedure findings, then the procedure report has been included in a sealed envelope for you to review at your convenience later.  YOU SHOULD EXPECT: Some feelings of bloating in the abdomen. Passage of more gas than usual.  Walking can help get rid of the air that was put into your GI tract during the procedure and reduce the bloating. If you had a lower endoscopy (such as a colonoscopy or flexible sigmoidoscopy) you may notice spotting of blood in your stool or on the toilet paper. If you underwent a bowel prep for your procedure, then you may not have a normal bowel movement for a few days.  DIET: Your first meal following the procedure should be a light meal and then it is ok to progress to your normal diet.  A half-sandwich or bowl of soup is an example of a good first meal.  Heavy or fried foods are harder to digest and may make you feel nauseous or bloated.  Likewise meals heavy in dairy and vegetables can cause extra gas to form and this can also increase the bloating.  Drink plenty of fluids but you should avoid alcoholic beverages for 24 hours.  ACTIVITY: Your care partner should take you home directly after the procedure.  You should plan to take it easy, moving slowly for the rest of the day.  You can resume normal activity the day after the procedure however you should NOT DRIVE or use heavy machinery for 24 hours (because of the sedation medicines used during the  test).    SYMPTOMS TO REPORT IMMEDIATELY: A gastroenterologist can be reached at any hour.  During normal business hours, 8:30 AM to 5:00 PM Monday through Friday, call [phone].  After hours and on weekends, please call the GI answering service at [phone] who will take a message and have the physician on call contact you.   Following lower endoscopy (colonoscopy or flexible sigmoidoscopy):  Excessive amounts of blood in the stool  Significant tenderness or worsening of abdominal pains  Swelling of the abdomen that is new, acute  Fever of 100F or higher  FOLLOW UP: If any biopsies were taken you will be contacted by phone or by letter within the next 1-3 weeks.  Call your gastroenterologist if you have not heard about the biopsies in 3 weeks.  Our staff will call the home number listed on your records the next business day following your procedure to check on you and address any questions or concerns that you may have at that time regarding the information given to you following your procedure. This is a courtesy call and so if there is no answer at the home number and we have not heard from you through the emergency physician on call, we will assume that you have returned to your regular daily activities without incident.  SIGNATURES/CONFIDENTIALITY: You and/or your care partner have signed paperwork which will be entered into your electronic medical record.  These signatures attest to the fact that that the  information above on your After Visit Summary has been reviewed and is understood.  Full responsibility of the confidentiality of this discharge information lies with you and/or your care-partner.

## 2013-07-10 NOTE — Progress Notes (Signed)
Procedure ends, to recovery, report given and VSS. 

## 2013-07-13 ENCOUNTER — Telehealth: Payer: Self-pay | Admitting: *Deleted

## 2013-07-13 NOTE — Telephone Encounter (Signed)
  Follow up Call-  Call back number 07/10/2013  Post procedure Call Back phone  # [phone]  Permission to leave phone message Yes    [facility]

## 2013-10-08 ENCOUNTER — Other Ambulatory Visit (INDEPENDENT_AMBULATORY_CARE_PROVIDER_SITE_OTHER): Payer: BC Managed Care – PPO

## 2013-10-08 ENCOUNTER — Encounter: Payer: Self-pay | Admitting: Internal Medicine

## 2013-10-08 ENCOUNTER — Ambulatory Visit (INDEPENDENT_AMBULATORY_CARE_PROVIDER_SITE_OTHER)
Admission: RE | Admit: 2013-10-08 | Discharge: 2013-10-08 | Disposition: A | Discharge status: Home / Self Care | Payer: BC Managed Care – PPO | Source: Ambulatory Visit | Attending: Internal Medicine | Admitting: Internal Medicine

## 2013-10-08 ENCOUNTER — Ambulatory Visit (INDEPENDENT_AMBULATORY_CARE_PROVIDER_SITE_OTHER): Payer: BC Managed Care – PPO | Admitting: Internal Medicine

## 2013-10-08 VITALS — BP 120/60 | HR 81 | Temp 97.5°F | Ht 67.25 in | Wt 191.2 lb

## 2013-10-08 DIAGNOSIS — Z0000 Encounter for general adult medical examination without abnormal findings: Secondary | ICD-10-CM

## 2013-10-08 DIAGNOSIS — M26609 Unspecified temporomandibular joint disorder, unspecified side: Secondary | ICD-10-CM

## 2013-10-08 DIAGNOSIS — E785 Hyperlipidemia, unspecified: Secondary | ICD-10-CM

## 2013-10-08 DIAGNOSIS — Z23 Encounter for immunization: Secondary | ICD-10-CM

## 2013-10-08 DIAGNOSIS — I10 Essential (primary) hypertension: Secondary | ICD-10-CM

## 2013-10-08 LAB — BASIC METABOLIC PANEL
BUN: 10 mg/dL (ref 6–23)
CO2: 27 meq/L (ref 19–32)
Calcium: 9.7 mg/dL (ref 8.4–10.5)
Chloride: 105 mEq/L (ref 96–112)
Creatinine, Ser: 0.8 mg/dL (ref 0.4–1.2)
GFR: 91.58 mL/min (ref >60.00)
Glucose, Bld: 110 mg/dL — ABNORMAL HIGH (ref 70–99)
POTASSIUM: 4.5 meq/L (ref 3.5–5.1)
SODIUM: 139 meq/L (ref 135–145)

## 2013-10-08 LAB — HEPATIC FUNCTION PANEL
ALK PHOS: 83 U/L (ref 39–117)
ALT: 24 U/L (ref 0–35)
AST: 22 U/L (ref 0–37)
Albumin: 4.1 g/dL (ref 3.5–5.2)
BILIRUBIN TOTAL: 0.4 mg/dL (ref 0.3–1.2)
Bilirubin, Direct: 0.1 mg/dL (ref 0.0–0.3)
TOTAL PROTEIN: 8.6 g/dL — AB (ref 6.0–8.3)

## 2013-10-08 LAB — LIPID PANEL
CHOLESTEROL: 192 mg/dL (ref 0–200)
HDL: 50.2 mg/dL (ref >39.00)
LDL Cholesterol: 126 mg/dL — ABNORMAL HIGH (ref 0–99)
TRIGLYCERIDES: 77 mg/dL (ref 0.0–149.0)
Total CHOL/HDL Ratio: 4
VLDL: 15.4 mg/dL (ref 0.0–40.0)

## 2013-10-08 LAB — CBC WITH DIFFERENTIAL/PLATELET
BASOS ABS: 0 10*3/uL (ref 0.0–0.1)
Basophils Relative: 0.4 % (ref 0.0–3.0)
EOS ABS: 0.1 10*3/uL (ref 0.0–0.7)
Eosinophils Relative: 2.3 % (ref 0.0–5.0)
HEMATOCRIT: 39.3 % (ref 36.0–46.0)
HEMOGLOBIN: 12.4 g/dL (ref 12.0–15.0)
LYMPHS ABS: 1.6 10*3/uL (ref 0.7–4.0)
Lymphocytes Relative: 37.8 % (ref 12.0–46.0)
MCHC: 31.6 g/dL (ref 30.0–36.0)
MCV: 71.3 fl — ABNORMAL LOW (ref 78.0–100.0)
MONOS PCT: 14.3 % — AB (ref 3.0–12.0)
Monocytes Absolute: 0.6 10*3/uL (ref 0.1–1.0)
Neutro Abs: 1.9 10*3/uL (ref 1.4–7.7)
Neutrophils Relative %: 45.2 % (ref 43.0–77.0)
Platelets: 250 10*3/uL (ref 150.0–400.0)
RBC: 5.51 Mil/uL — ABNORMAL HIGH (ref 3.87–5.11)
RDW: 15 % — ABNORMAL HIGH (ref 11.5–14.6)
WBC: 4.2 10*3/uL — ABNORMAL LOW (ref 4.5–10.5)

## 2013-10-08 LAB — URINALYSIS, ROUTINE W REFLEX MICROSCOPIC
Bilirubin Urine: NEGATIVE
Hgb urine dipstick: NEGATIVE
Ketones, ur: NEGATIVE
Nitrite: NEGATIVE
RBC / HPF: NONE SEEN (ref 0–?)
Specific Gravity, Urine: 1.025 (ref 1.000–1.030)
Total Protein, Urine: NEGATIVE
URINE GLUCOSE: NEGATIVE
Urobilinogen, UA: 0.2 (ref 0.0–1.0)
pH: 6 (ref 5.0–8.0)

## 2013-10-08 LAB — TSH: TSH: 5.29 u[IU]/mL (ref 0.35–5.50)

## 2013-10-08 LAB — SEDIMENTATION RATE: Sed Rate: 25 mm/hr — ABNORMAL HIGH (ref 0–22)

## 2013-10-08 NOTE — Progress Notes (Signed)
Subjective:     Patient ID: Sandy ArnoldPhyllis A Bennett, female   DOB: Oct 11, 1950    MRN: [id number]   Brief patient profile:  62 yobf quit smoking around 1985 with h/o mod obesity, hyperlipidemia   December 23, 2008 ov new onset left jaw pain x 4 weeks waxes and wanes but def worse x 4 days and now pain on chewing, no ear c/o's or sinus c/os, not worse walking. imp was ? tmj, try otc nsaids with dental f/u > felt was not tmj but dental in origin.      July 05, 2009 ov no luck with wt loss. now with progressive numbness indolent onset right hand numb x 1 month. all fingers, no neck pain. 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  No change rx    05/27/2013 Acute OV  Complains of left ear discomfort and congestion x3 days No drainage, fever, sinus pain, headache.  No visual/speech changes. Had mild lightheadedness yesterday, resolved today. rec Take Claritin D for next 3-4 days then as needed for sinus congestion .    06/01/2013 f/u ov/Wert persistent L ear pain Chief Complaint  Patient presents with  . Acute Visit    Pt c/o ear pain for approx 1 wk- taking otc ear drops and this has helped some. Tried taking claritin, but did not help.   no nasal congestion/ cold - like symptoms, fever, sorethroat, hearing changes or vertigo, no pain when chew.  Worse lying down, better with ear drops rec cortisoporin otic solution 3 drops L ear up to 4 x daily > call for ent eval if not better Return for cpx 09/12/13    10/08/2013 f/u ov/Wert re: cpx Chief Complaint  Patient presents with  . Annual Exam    Pt states doing well and denies any co's today. Pt is fasting.      No  Sob or  chronic cough or cp or chest tightness, subjective wheeze overt sinus or hb symptoms. No unusual exp hx or h/o childhood pna/ asthma or knowledge of premature birth.     . Also denies any obvious fluctuation of symptoms with weather or environmental  changes or other aggravating or alleviating factors except as outlined above  Current Medications, Allergies, Complete Past Medical History, Past Surgical History, Family History, and Social History were reviewed in [name] electronic medical record.  ROS  The following are not active complaints unless bolded sore throat, dysphagia, dental problems, itching, sneezing,  nasal congestion or excess/ purulent secretions, ear ache,   fever, chills, sweats, unintended wt loss, pleuritic or exertional cp, hemoptysis,  orthopnea pnd or leg swelling, presyncope, palpitations, heartburn, abdominal pain, anorexia, nausea, vomiting, diarrhea  or change in bowel or urinary habits, change in stools or urine, dysuria,hematuria,  rash, arthralgias, visual complaints, headache, numbness weakness or ataxia or problems with walking or coordination,  change in mood/affect or memory.                   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 cx  brothers and sisters all healthy, only one full (sister) - she is the younger of full sister, older than others unsure of father's               Objective:   Physical Exam    wt 196 December 23, 2008 > 198 March 30, 2009 > 200 July 05, 2009 > 196 April 18, 2010 > 191 09/11/2011  >188 02/15/2012 > 09/12/2012  195>     194 06/01/2013 > 10/08/2013 191  Ambulatory healthy  appearing in no acute distress.  HEENT: upper dentures, nl turbinates, and orophanx. Pain on manipulation of L ear but no redness or drainage.  Neck without JVD/Nodes/TM, nl carotids with no bruits  Lungs clear to A and P bilaterally without cough on insp or exp maneuvers  RRR no s3 or murmur or increase in P2 no edema decreased sym pulses both feet  Abd soft and benign with nl excursion in the supine position. No bruits or organomegaly   Ext warm without calf tenderness, cyanosis clubbing/     nml hand grips ,   Neuro : intact sensorium, nl gait, no motor deficits    CXR  10/08/2013 :  No active cardiopulmonary disease. No change from prior study.  Labs 10/08/2013 ok though TSH upper limits of nl   Assessment:

## 2013-10-08 NOTE — Progress Notes (Signed)
Quick Note:  LMTCB ______ 

## 2013-10-08 NOTE — Patient Instructions (Addendum)
Please remember to go to the lab and x-ray department downstairs for your tests - we will call you with the results when they are available.     Follow up in one year, sooner if needed

## 2013-10-09 NOTE — Assessment & Plan Note (Signed)
Lab Results  Component Value Date   CREATININE 0.8 10/08/2013   CREATININE 0.8 09/12/2012   CREATININE 0.8 09/11/2011    resolved

## 2013-10-09 NOTE — Assessment & Plan Note (Signed)
nsaids prn ok control

## 2013-10-09 NOTE — Assessment & Plan Note (Signed)
Tdap updated GYN health maint reviewed Noted boderline tsh, check next ov

## 2013-10-09 NOTE — Assessment & Plan Note (Signed)
-   Target < 160 (no additional risk factors)   Lab Results  Component Value Date   LDLCALC 126* 10/08/2013     Adequate control on present rx, reviewed > no change in rx needed

## 2013-11-20 IMAGING — CR DG CHEST 2V
2 series · 2 of 2 positions shown · non-contrast
Comparison: 09/12/2011

CLINICAL DATA: Hypertension

CHEST - 2 VIEW

[view not recorded (1 of 2)]
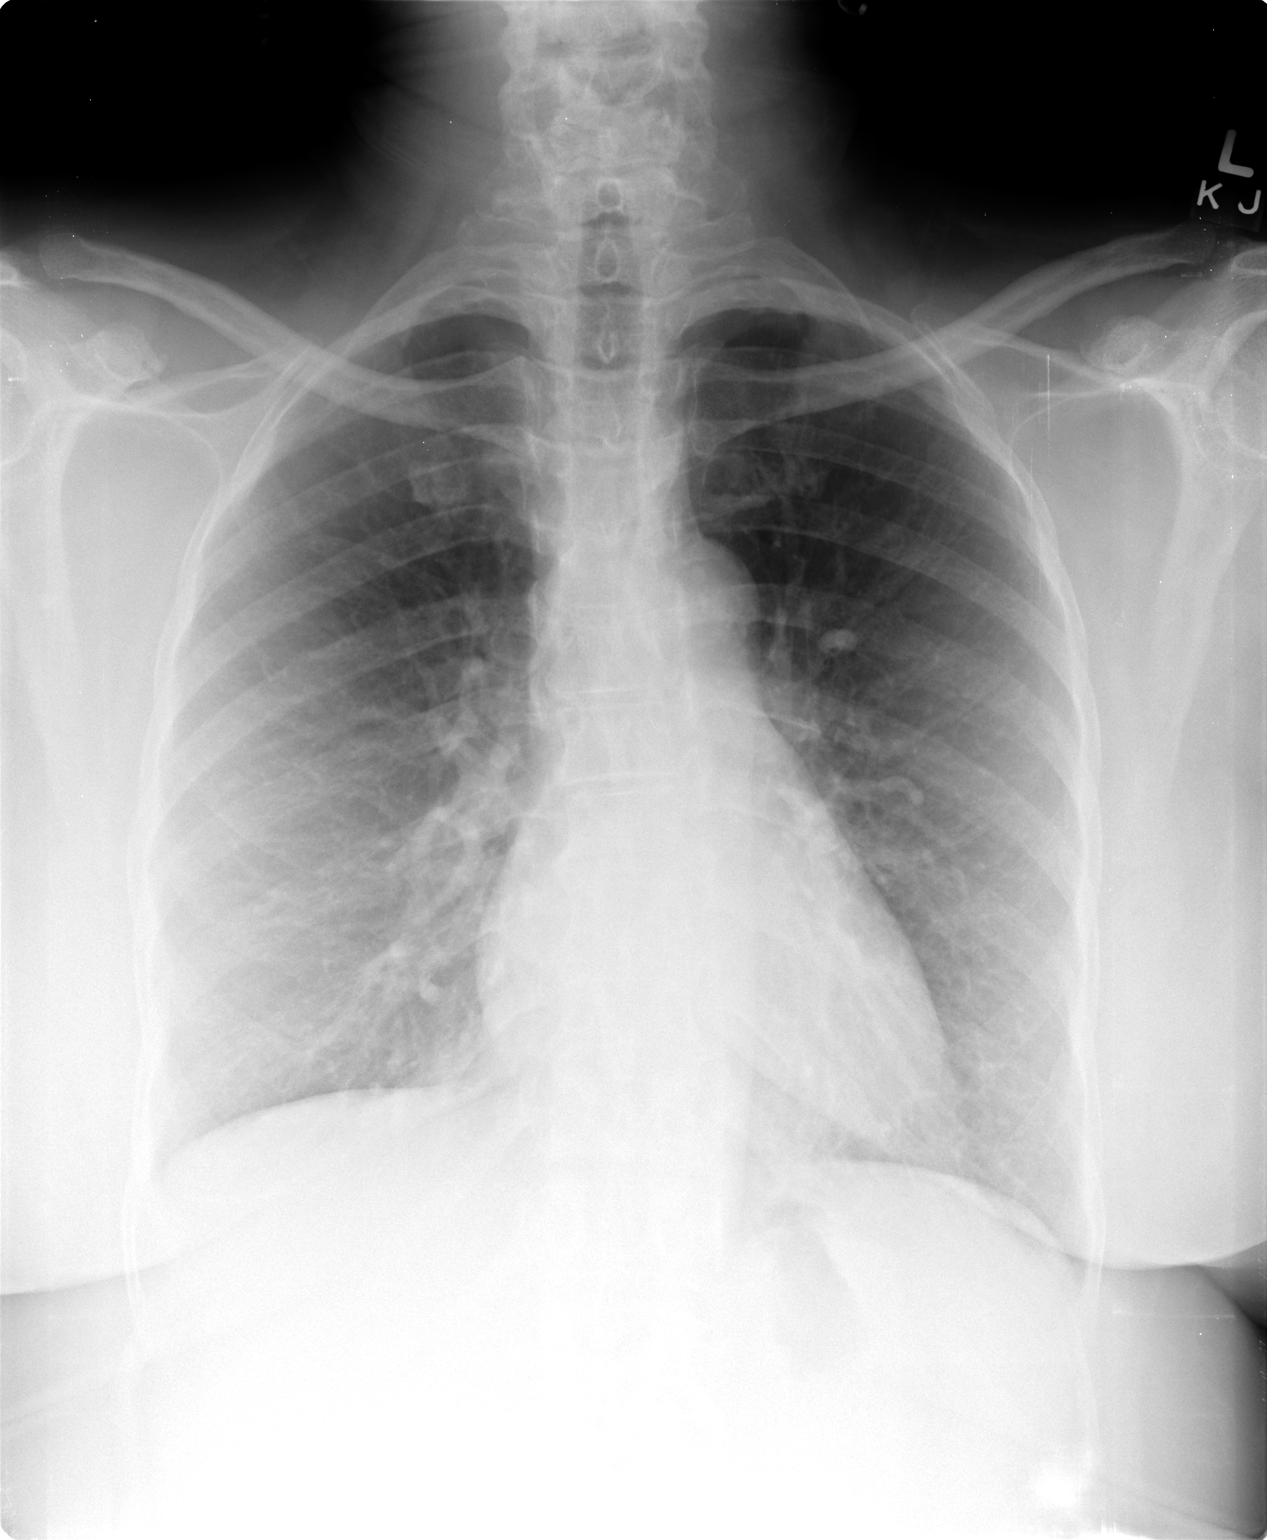

[view not recorded (2 of 2)]
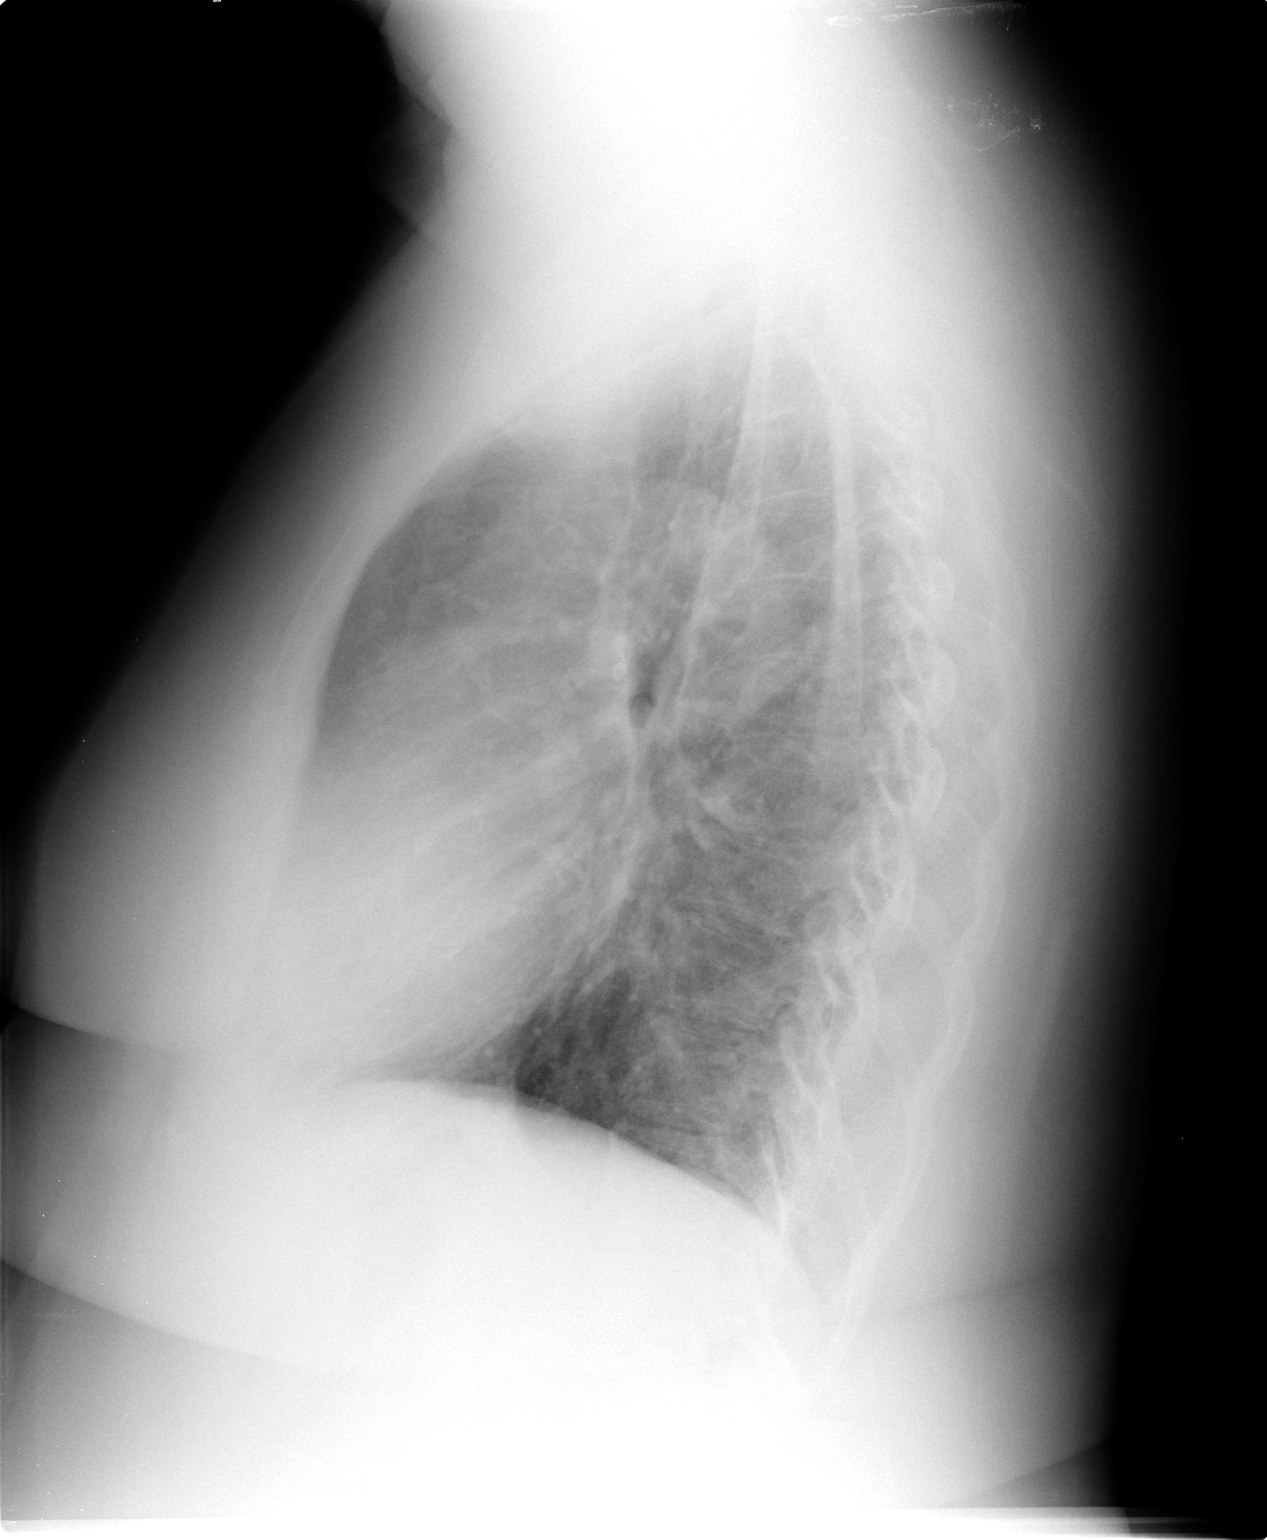

[2 of 2 positions shown; findings below may reference images not displayed]

FINDINGS: Normal heart size and vascularity.  No focal pneumonia,
collapse, consolidation, effusion or pneumothorax.  Trachea
midline.  Slight scoliosis of the spine, unchanged.
IMPRESSION: Stable exam.  No acute finding

## 2014-01-23 ENCOUNTER — Telehealth: Payer: Self-pay | Admitting: Internal Medicine

## 2014-01-23 MED ORDER — AMOXICILLIN-POT CLAVULANATE 875-125 MG PO TABS: ORAL_TABLET | ORAL | Status: DC

## 2014-01-23 NOTE — Telephone Encounter (Signed)
Sick over a week now with worse sore throat ear aches, stuffy head and congested cough with green mucus but no fever or sob   Augmentin 875 mg take one pill twice daily  X 10 days - take at breakfast and supper with large glass of water.  It would help reduce the usual side effects (diarrhea and yeast infections) if you ate cultured yogurt at lunch.   See us in 48 h if not better, to ER in meantim e if worse

## 2014-01-23 NOTE — Telephone Encounter (Signed)
Changed pharmacies as hers closed

## 2014-01-25 ENCOUNTER — Ambulatory Visit (INDEPENDENT_AMBULATORY_CARE_PROVIDER_SITE_OTHER): Payer: BC Managed Care – PPO | Admitting: Internal Medicine

## 2014-01-25 ENCOUNTER — Encounter: Payer: Self-pay | Admitting: Internal Medicine

## 2014-01-25 ENCOUNTER — Encounter: Payer: Self-pay | Admitting: *Deleted

## 2014-01-25 ENCOUNTER — Encounter (INDEPENDENT_AMBULATORY_CARE_PROVIDER_SITE_OTHER): Payer: Self-pay

## 2014-01-25 VITALS — BP 120/74 | HR 95 | Temp 97.9°F | Ht 67.5 in | Wt 192.0 lb

## 2014-01-25 DIAGNOSIS — R05 Cough: Secondary | ICD-10-CM

## 2014-01-25 DIAGNOSIS — R059 Cough, unspecified: Secondary | ICD-10-CM

## 2014-01-25 MED ORDER — PREDNISONE 10 MG PO TABS: ORAL_TABLET | ORAL | Status: DC

## 2014-01-25 MED ORDER — FAMOTIDINE 20 MG PO TABS: ORAL_TABLET | ORAL | Status: DC

## 2014-01-25 MED ORDER — TRAMADOL HCL 50 MG PO TABS: ORAL_TABLET | ORAL | Status: DC

## 2014-01-25 NOTE — Assessment & Plan Note (Signed)
prob uri/ ? Rhinitis/ sinusitis now with disproportionate cough on day 2/10 augmentin planned  Explained natural history of uri and why it's necessary in patients at risk to treat GERD aggressively  at least  short term   to reduce risk of evolving cyclical cough initially  triggered by epithelial injury and a heightened sensitivty to the effects of any upper airway irritants,  most importantly acid - related.  That is, the more sensitive the epithelium damaged for virus, the more the cough, the more the secondary reflux (especially in those prone to reflux) the more the irritation of the sensitive mucosa and so on in a cyclical pattern.   See instructions for specific recommendations which were reviewed directly with the patient who was given a copy with highlighter outlining the key components.

## 2014-01-25 NOTE — Progress Notes (Signed)
Subjective:     Patient ID: Sandy Bennett, female   DOB: 1951-06-09    MRN: [id number]   Brief patient profile:  [age] yobf quit smoking around 1985 with h/o mod obesity, hyperlipidemia    History of Present Illness   05/27/2013 Acute OV  Complains of left ear discomfort and congestion x3 days No drainage, fever, sinus pain, headache.  No visual/speech changes. Had mild lightheadedness yesterday, resolved today. rec Take Claritin D for next 3-4 days then as needed for sinus congestion .    06/01/2013 f/u ov/Sandy Bennett persistent L ear pain Chief Complaint  Patient presents with  . Acute Visit    Pt c/o ear pain for approx 1 wk- taking otc ear drops and this has helped some. Tried taking claritin, but did not help.   no nasal congestion/ cold - like symptoms, fever, sorethroat, hearing changes or vertigo, no pain when chew.  Worse lying down, better with ear drops rec cortisoporin otic solution 3 drops L ear up to 4 x daily > call for ent eval if not better Return for cpx 09/12/13    01/25/2014 acute  ov/Sandy Bennett re:  ?uri Chief Complaint  Patient presents with  . Acute Visit    Pt c/o ear pain, sore throat and cough for approx 1 wk.  Cough is occ prod with minimal clear sputum.    mucus was green but started augmentin on 01/23/14 and now more clear but constant sensation of need to clear throat and hacking cough with ant gen soreness in chest with coughing.  No  Sob or chest tightness, subjective wheeze overt sinus or hb symptoms. No unusual exp hx or h/o childhood pna/ asthma or knowledge of premature birth.     . Also denies any obvious fluctuation of symptoms with weather or environmental changes or other aggravating or alleviating factors except as outlined above  Current Medications, Allergies, Complete Past Medical History, Past Surgical History, Family History, and Social History were reviewed in [name] electronic medical record.  ROS  The following are not active complaints  unless bolded sore throat, dysphagia, dental problems, itching, sneezing,  nasal congestion or excess/ purulent secretions, ear ache,   fever, chills, sweats, unintended wt loss, pleuritic or exertional cp, hemoptysis,  orthopnea pnd or leg swelling, presyncope, palpitations, heartburn, abdominal pain, anorexia, nausea, vomiting, diarrhea  or change in bowel or urinary habits, change in stools or urine, dysuria,hematuria,  rash, arthralgias, visual complaints, headache, numbness weakness or ataxia or problems with walking or coordination,  change in mood/affect or memory.                   Allergies  No Known Drug Allergies    Past Medical History:  Hyperlipidemia  - Target < 160 (no additional risk factors)  Mild Obesity  - Target wt = 190 for BMI < 30, < 171 ideal  Health Maintenance....................................................................Marland Kitchen.Sandy Bennett  - pneumovax 01/25/2014  - Td 10/08/2013  - CPX 10/08/2013   - DEXA wnl  06/12/2010 - GYN  Sandy Bennett  MVP, needs dental prophylaxis  - See Echo 03/12/95  Diverticulosis  - Colonoscopy 12/19/2007     Past Surgical History:  s/p cholecystectomy 1994  s/p partial hysterectomy 1970's benign  s/p carpal tunnel surgery Right hand 06/2009    Family History:  Cancer mother ? cx  brothers and sisters all healthy, only one full (sister) - she is the younger of full sister, older than others unsure of father's  Objective:   Physical Exam    wt 196 December 23, 2008 > 198 March 30, 2009 > 200 July 05, 2009 > 196 April 18, 2010 > 191 09/11/2011  >188 02/15/2012 > 09/12/2012  195>     194 06/01/2013 > 10/08/2013 191 > 01/25/2014 192   Ambulatory anxious appearing bf nad in no acute distress with freq throat clearing / pseudowheeze when coughing only    HEENT: upper dentures, nl turbinates, and orophanx. Pain on manipulation of L ear but no redness or drainage.  Neck without JVD/Nodes/TM, nl carotids with no bruits   Lungs clear to A and P bilaterally without cough on insp or exp maneuvers  RRR no s3 or murmur or increase in P2 no edema decreased sym pulses both feet  Abd soft and benign with nl excursion in the supine position. No bruits or organomegaly   Ext warm without calf tenderness, cyanosis clubbing/     nml hand grips ,   Neuro : intact sensorium, nl gait, no motor deficits    CXR  10/08/2013 :  No active cardiopulmonary disease. No change from prior study.  Labs 10/08/2013 ok though TSH upper limits of nl   Assessment:

## 2014-01-25 NOTE — Patient Instructions (Addendum)
Take delsym two tsp every 12 hours and supplement if needed with  tramadol 50 mg up to 1 every 4 hours to suppress the urge to cough (but may make you sleepy). Swallowing water or using ice chips/non mint and menthol containing candies (such as lifesavers or sugarless jolly ranchers) are also effective.  You should rest your voice and avoid activities that you know make you cough.  Once you have eliminated the cough for 3 straight days try reducing the tramadol first,  then the delsym as tolerated.    Prednisone 10 mg take  4 each am x 2 days,   2 each am x 2 days,  1 each am x 2 days and stop   Pepcid 20 mg one after bfast and one bedtime until cough is completely gone for at least a week without the need for cough suppression  See us back in 2 weeks if not completely better

## 2014-02-08 ENCOUNTER — Ambulatory Visit: Payer: BC Managed Care – PPO | Admitting: Internal Medicine

## 2014-02-12 ENCOUNTER — Telehealth: Payer: Self-pay | Admitting: Internal Medicine

## 2014-02-12 ENCOUNTER — Ambulatory Visit (INDEPENDENT_AMBULATORY_CARE_PROVIDER_SITE_OTHER): Payer: BC Managed Care – PPO | Admitting: Adult Health

## 2014-02-12 ENCOUNTER — Encounter: Payer: Self-pay | Admitting: Adult Health

## 2014-02-12 VITALS — BP 104/68 | HR 82 | Temp 98.2°F | Ht 67.5 in | Wt 190.4 lb

## 2014-02-12 DIAGNOSIS — R05 Cough: Secondary | ICD-10-CM

## 2014-02-12 DIAGNOSIS — R059 Cough, unspecified: Secondary | ICD-10-CM

## 2014-02-12 MED ORDER — TRAMADOL HCL 50 MG PO TABS: ORAL_TABLET | ORAL | Status: DC

## 2014-02-12 NOTE — Telephone Encounter (Signed)
I called spoke with pt. appt scheduled to see TP this afternoon. Nothing further needed

## 2014-02-12 NOTE — Assessment & Plan Note (Signed)
Upper airway Cough with Rhinitis /GERD   Plan  Take delsym two tsp every 12 hours and supplement if needed with  tramadol 50 mg up to 1 every 4 hours to suppress the urge to cough (but may make you sleepy). Swallowing water or using ice chips/non mint and menthol containing candies (such as lifesavers or sugarless jolly ranchers) are also effective.  You should rest your voice and avoid activities that you know make you cough. Once you have eliminated the cough for 3 straight days try reducing the tramadol first,  then the delsym as tolerated.   Restart Pepcid 20 mg one after bfast and one bedtime  follow up Dr. Sherene Sires  In 4 weeks and As needed

## 2014-02-12 NOTE — Patient Instructions (Signed)
Take delsym two tsp every 12 hours and supplement if needed with  tramadol 50 mg up to 1 every 4 hours to suppress the urge to cough (but may make you sleepy). Swallowing water or using ice chips/non mint and menthol containing candies (such as lifesavers or sugarless jolly ranchers) are also effective.  You should rest your voice and avoid activities that you know make you cough. Once you have eliminated the cough for 3 straight days try reducing the tramadol first,  then the delsym as tolerated.   Restart Pepcid 20 mg one after bfast and one bedtime  follow up Dr. Sherene Sires  In 4 weeks and As needed

## 2014-02-12 NOTE — Progress Notes (Signed)
Subjective:     Patient ID: Sandy Bennett, female   DOB: 27-Apr-1951    MRN: [id number]   Brief patient profile:  [age] yobf quit smoking around 1985 with h/o mod obesity, hyperlipidemia    History of Present Illness   05/27/2013 Acute OV  Complains of left ear discomfort and congestion x3 days No drainage, fever, sinus pain, headache.  No visual/speech changes. Had mild lightheadedness yesterday, resolved today. rec Take Claritin D for next 3-4 days then as needed for sinus congestion .    06/01/2013 f/u ov/Wert persistent L ear pain Chief Complaint  Patient presents with  . Acute Visit    Pt c/o ear pain for approx 1 wk- taking otc ear drops and this has helped some. Tried taking claritin, but did not help.   no nasal congestion/ cold - like symptoms, fever, sorethroat, hearing changes or vertigo, no pain when chew.  Worse lying down, better with ear drops rec cortisoporin otic solution 3 drops L ear up to 4 x daily > call for ent eval if not better Return for cpx 09/12/13    01/25/2014 acute  ov/Wert re:  ?uri Chief Complaint  Patient presents with  . Acute Visit    Pt c/o ear pain, sore throat and cough for approx 1 wk.  Cough is occ prod with minimal clear sputum.    mucus was green but started augmentin on 01/23/14 and now more clear but constant sensation of need to clear throat and hacking cough with ant gen soreness in chest with coughing. >>Delsym/Tramadol , pred taper , pepcid Twice daily    02/12/2014 Acute OV  Complains of cough is not improved since last OV. Pt c/o incr cough and throat soreness.  Cough is mainly at night.  Has throat clearing ? Drainage. Hoarseness.  Did get better after last ov, but cough came back yesterday.  CXR 09/2013 NAD  Ran out delsym , and took 1 tramadol last night.  Stopped pepcid when she got better.  No hemoptysis, discolored mucus, fever, n/v/d, recent travel or calf pain.     Current Medications, Allergies, Complete Past Medical  History, Past Surgical History, Family History, and Social History were reviewed in [name] electronic medical record.  ROS  The following are not active complaints unless bolded sore throat, dysphagia, dental problems, itching, sneezing,  nasal congestion or excess/ purulent secretions, ear ache,   fever, chills, sweats, unintended wt loss, pleuritic or exertional cp, hemoptysis,  orthopnea pnd or leg swelling, presyncope, palpitations, heartburn, abdominal pain, anorexia, nausea, vomiting, diarrhea  or change in bowel or urinary habits, change in stools or urine, dysuria,hematuria,  rash, arthralgias, visual complaints, headache, numbness weakness or ataxia or problems with walking or coordination,  change in mood/affect or memory.                   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 cx  brothers and sisters all healthy, only one full (sister) - she is the younger of full sister, older than others unsure of father's       Objective:   Physical Exam    wt 196 December 23, 2008 > 198 March 30, 2009 > 200 July 05, 2009 > 196 April 18, 2010 > 191 09/11/2011  >188 02/15/2012 > 09/12/2012  195>     194 06/01/2013 > 10/08/2013 191 > 01/25/2014 192 >190 02/12/2014   Ambulatory anxious appearing bf nad in no acute distress with freq throat clearing / pseudowheeze when coughing only     HEENT: upper dentures, nl turbinates, and orophanx. Pain on manipulation of L ear but no redness or drainage.  Neck without JVD/Nodes/TM, nl carotids with no bruits  Lungs clear to A and P bilaterally without cough on insp or exp maneuvers  RRR no s3 or murmur or increase in P2 no edema decreased sym pulses both feet  Abd soft and benign with nl excursion in the supine position. No bruits or organomegaly   Ext warm without calf tenderness, cyanosis clubbing/     nml hand grips ,   Neuro : intact sensorium, nl gait, no motor deficits    CXR  10/08/2013 :  No active cardiopulmonary disease. No change from prior study.  Labs 10/08/2013 ok though TSH upper limits of nl   Assessment:

## 2014-02-12 NOTE — Addendum Note (Signed)
Addended by: Boone Master E on: 02/12/2014 04:06 PM   Modules accepted: Orders

## 2014-03-12 ENCOUNTER — Ambulatory Visit: Payer: BC Managed Care – PPO | Admitting: Internal Medicine

## 2014-04-02 ENCOUNTER — Ambulatory Visit: Payer: BC Managed Care – PPO | Admitting: Internal Medicine

## 2014-08-27 ENCOUNTER — Other Ambulatory Visit: Payer: Self-pay | Admitting: Obstetrics and Gynecology

## 2014-08-27 DIAGNOSIS — R928 Other abnormal and inconclusive findings on diagnostic imaging of breast: Secondary | ICD-10-CM

## 2014-09-10 ENCOUNTER — Ambulatory Visit
Admission: RE | Admit: 2014-09-10 | Discharge: 2014-09-10 | Disposition: A | Discharge status: Home / Self Care | Payer: BC Managed Care – PPO | Source: Ambulatory Visit | Attending: Obstetrics and Gynecology | Admitting: Obstetrics and Gynecology

## 2014-09-10 DIAGNOSIS — R928 Other abnormal and inconclusive findings on diagnostic imaging of breast: Secondary | ICD-10-CM

## 2014-10-26 ENCOUNTER — Encounter (INDEPENDENT_AMBULATORY_CARE_PROVIDER_SITE_OTHER): Payer: Self-pay

## 2014-10-26 ENCOUNTER — Other Ambulatory Visit (INDEPENDENT_AMBULATORY_CARE_PROVIDER_SITE_OTHER): Payer: BC Managed Care – PPO

## 2014-10-26 ENCOUNTER — Ambulatory Visit (INDEPENDENT_AMBULATORY_CARE_PROVIDER_SITE_OTHER)
Admission: RE | Admit: 2014-10-26 | Discharge: 2014-10-26 | Disposition: A | Discharge status: Home / Self Care | Payer: BC Managed Care – PPO | Source: Ambulatory Visit | Attending: Internal Medicine | Admitting: Internal Medicine

## 2014-10-26 ENCOUNTER — Ambulatory Visit (INDEPENDENT_AMBULATORY_CARE_PROVIDER_SITE_OTHER): Payer: BC Managed Care – PPO | Admitting: Internal Medicine

## 2014-10-26 ENCOUNTER — Encounter: Payer: Self-pay | Admitting: Internal Medicine

## 2014-10-26 VITALS — BP 124/70 | HR 78 | Ht 67.5 in | Wt 191.0 lb

## 2014-10-26 DIAGNOSIS — Z0000 Encounter for general adult medical examination without abnormal findings: Secondary | ICD-10-CM

## 2014-10-26 DIAGNOSIS — E785 Hyperlipidemia, unspecified: Secondary | ICD-10-CM

## 2014-10-26 DIAGNOSIS — I10 Essential (primary) hypertension: Secondary | ICD-10-CM

## 2014-10-26 DIAGNOSIS — R05 Cough: Secondary | ICD-10-CM

## 2014-10-26 DIAGNOSIS — R059 Cough, unspecified: Secondary | ICD-10-CM

## 2014-10-26 LAB — BASIC METABOLIC PANEL
BUN: 12 mg/dL (ref 6–23)
CHLORIDE: 105 meq/L (ref 96–112)
CO2: 27 mEq/L (ref 19–32)
CREATININE: 0.8 mg/dL (ref 0.40–1.20)
Calcium: 10.2 mg/dL (ref 8.4–10.5)
GFR: 92.59 mL/min (ref >60.00)
GLUCOSE: 103 mg/dL — AB (ref 70–99)
Potassium: 4.4 mEq/L (ref 3.5–5.1)
Sodium: 139 mEq/L (ref 135–145)

## 2014-10-26 LAB — LIPID PANEL
CHOL/HDL RATIO: 3
Cholesterol: 192 mg/dL (ref 0–200)
HDL: 59.6 mg/dL (ref >39.00)
LDL CALC: 107 mg/dL — AB (ref 0–99)
NonHDL: 132.4
TRIGLYCERIDES: 128 mg/dL (ref 0.0–149.0)
VLDL: 25.6 mg/dL (ref 0.0–40.0)

## 2014-10-26 LAB — CBC WITH DIFFERENTIAL/PLATELET
Basophils Absolute: 0 10*3/uL (ref 0.0–0.1)
Basophils Relative: 0.5 % (ref 0.0–3.0)
EOS PCT: 2.4 % (ref 0.0–5.0)
Eosinophils Absolute: 0.1 10*3/uL (ref 0.0–0.7)
HEMATOCRIT: 38.9 % (ref 36.0–46.0)
Hemoglobin: 12.4 g/dL (ref 12.0–15.0)
LYMPHS ABS: 1.7 10*3/uL (ref 0.7–4.0)
Lymphocytes Relative: 40.8 % (ref 12.0–46.0)
MCHC: 31.8 g/dL (ref 30.0–36.0)
MCV: 71.2 fl — AB (ref 78.0–100.0)
Monocytes Absolute: 0.5 10*3/uL (ref 0.1–1.0)
Monocytes Relative: 11.3 % (ref 3.0–12.0)
NEUTROS PCT: 45 % (ref 43.0–77.0)
Neutro Abs: 1.9 10*3/uL (ref 1.4–7.7)
Platelets: 259 10*3/uL (ref 150.0–400.0)
RBC: 5.46 Mil/uL — AB (ref 3.87–5.11)
RDW: 15.3 % (ref 11.5–15.5)
WBC: 4.2 10*3/uL (ref 4.0–10.5)

## 2014-10-26 LAB — HEPATIC FUNCTION PANEL
ALBUMIN: 4.3 g/dL (ref 3.5–5.2)
ALK PHOS: 86 U/L (ref 39–117)
ALT: 27 U/L (ref 0–35)
AST: 22 U/L (ref 0–37)
BILIRUBIN TOTAL: 0.3 mg/dL (ref 0.2–1.2)
Bilirubin, Direct: 0 mg/dL (ref 0.0–0.3)
Total Protein: 8.2 g/dL (ref 6.0–8.3)

## 2014-10-26 LAB — TSH: TSH: 3.27 u[IU]/mL (ref 0.35–4.50)

## 2014-10-26 NOTE — Progress Notes (Signed)
Quick Note:  Spoke with pt and notified of results per Dr. Wert. Pt verbalized understanding and denied any questions.  ______ 

## 2014-10-26 NOTE — Progress Notes (Signed)
Subjective:     Patient ID: Sandy Bennett, female   DOB: 1951/09/15    MRN: [id number]   Brief patient profile:  63 yobf quit smoking around 1985 with h/o mod obesity, hyperlipidemia    History of Present Illness    10/26/2014 f/u ov/Sandy Bennett re: cpx  Chief Complaint  Patient presents with  . Annual Exam    Pt is fasting. She still c/o occ cough- sometimes prod with white sputum.      Cough typically lasts up to 3 weeks after a "cold" maybe once or twice a year but not with season changes    No obvious day to day or daytime variabilty or assoc chronic sob or  cp or chest tightness, subjective wheeze overt sinus or hb symptoms. No unusual exp hx or h/o childhood pna/ asthma or knowledge of premature birth.  Sleeping ok without nocturnal  or early am exacerbation  of respiratory  c/o's or need for noct saba. Also denies any obvious fluctuation of symptoms with weather or environmental changes or other aggravating or alleviating factors except as outlined above   Current Medications, Allergies, Complete Past Medical History, Past Surgical History, Family History, and Social History were reviewed in [facility] record.  ROS  The following are not active complaints unless bolded sore throat, dysphagia, dental problems, itching, sneezing,  nasal congestion or excess/ purulent secretions, ear ache,   fever, chills, sweats, unintended wt loss, pleuritic or exertional cp, hemoptysis,  orthopnea pnd or leg swelling, presyncope, palpitations, heartburn, abdominal pain, anorexia, nausea, vomiting, diarrhea  or change in bowel or urinary habits, change in stools or urine, dysuria,hematuria,  rash, arthralgias, visual complaints, headache, numbness weakness or ataxia or problems with walking or coordination,  change in mood/affect or memory.          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 cx  brothers and sisters all healthy, only one full (sister) - she is the younger of full sister, pt older than others unsure of father's       Objective:   Physical Exam    wt 196 December 23, 2008 > 198 March 30, 2009 > 200 July 05, 2009 > 196 April 18, 2010 > 191 09/11/2011  >188 02/15/2012 > 09/12/2012  195>     194 06/01/2013 > 10/08/2013 191 > 01/25/2014 192 >190 02/12/2014 > 10/26/2014  191   Ambulatory anxious appearing bf nad in no acute distress with freq throat clearing / pseudowheeze when coughing only    HEENT: upper dentures, nl turbinates, and orophanx.  Ear canals wax R > L  Neck without JVD/Nodes/TM, nl carotids with no bruits  Lungs clear to A and P bilaterally without cough on insp or exp maneuvers  RRR no s3 or murmur or increase in P2 no edema decreased sym pulses both feet  Abd soft and benign  with nl excursion in the supine position. No bruits or organomegaly   Ext warm without calf tenderness, cyanosis clubbing/     nml hand grips ,   Neuro : intact sensorium, nl gait, no motor deficits   Labs ordered today / images reviewed include:   Recent Labs Lab 10/26/14 0918  NA 139  K 4.4  CL 105  CO2 27  BUN 12  CREATININE 0.80  GLUCOSE 103*    Recent Labs Lab 10/26/14 0918  HGB 12.4  HCT 38.9  WBC 4.2  PLT 259.0     Lab Results  Component Value Date   TSH 3.27 10/26/2014        chest X-ray:  No active cardiopulmonary disease. Stable  exam.        Assessment:

## 2014-10-26 NOTE — Patient Instructions (Addendum)
Whenever you have any flare of respiratory symptoms > take pepcid ac 20 mg after bfast and supper until completely gone for a week   GERD (REFLUX)  is an extremely common cause of respiratory symptoms just like yours , many times with no obvious heartburn at all.    It can be treated with medication, but also with lifestyle changes including avoidance of late meals, excessive alcohol, smoking cessation, and avoid fatty foods, chocolate, peppermint, colas, red wine, and acidic juices such as orange juice.  NO MINT OR MENTHOL PRODUCTS SO NO COUGH DROPS  USE SUGARLESS CANDY INSTEAD (Jolley ranchers or Stover's or Life Savers) or even ice chips will also do - the key is to swallow to prevent all throat clearing. NO OIL BASED VITAMINS - use powdered substitutes.    Please remember to go to the lab and x-ray department downstairs for your tests - we will call you with the results when they are available.     Return in a year sooner if needed

## 2014-10-31 ENCOUNTER — Encounter: Payer: Self-pay | Admitting: Internal Medicine

## 2014-10-31 NOTE — Assessment & Plan Note (Signed)
Explained the natural history of uri and why it's necessary in patients at risk to treat GERD aggressively - at least  short term -   to reduce risk of evolving cyclical cough initially  triggered by epithelial injury and a heightened sensitivty to the effects of any upper airway irritants,  most importantly acid - related - then perpetuated by epithelial injury related to the cough itself as the upper airway collapses on itself.  That is, the more sensitive the epithelium becomes once it is damaged by the virus, the more the ensuing irritability> the more the cough, the more the secondary reflux (especially in those prone to reflux) the more the irritation of the sensitive mucosa and so on in a  Classic cyclical pattern.   

## 2014-10-31 NOTE — Assessment & Plan Note (Signed)
Adequate control on present rx, reviewed > no change in rx needed   

## 2014-10-31 NOTE — Assessment & Plan Note (Signed)
-   Target  < 160 borerline hbp with no additional risk factors   Lab Results  Component Value Date   CHOL 192 10/26/2014   HDL 59.60 10/26/2014   LDLCALC 107* 10/26/2014   TRIG 128.0 10/26/2014   CHOLHDL 3 10/26/2014     Adequate control on present rx, reviewed > no change in rx needed

## 2015-03-29 ENCOUNTER — Other Ambulatory Visit: Payer: Self-pay | Admitting: Internal Medicine

## 2015-03-29 DIAGNOSIS — R921 Mammographic calcification found on diagnostic imaging of breast: Secondary | ICD-10-CM

## 2015-04-18 ENCOUNTER — Ambulatory Visit
Admission: RE | Admit: 2015-04-18 | Discharge: 2015-04-18 | Disposition: A | Discharge status: Home / Self Care | Payer: BC Managed Care – PPO | Source: Ambulatory Visit | Attending: Internal Medicine | Admitting: Internal Medicine

## 2015-04-18 DIAGNOSIS — Z0000 Encounter for general adult medical examination without abnormal findings: Secondary | ICD-10-CM

## 2015-04-18 DIAGNOSIS — R921 Mammographic calcification found on diagnostic imaging of breast: Secondary | ICD-10-CM

## 2015-08-29 ENCOUNTER — Other Ambulatory Visit: Payer: Self-pay

## 2015-08-29 DIAGNOSIS — Z1231 Encounter for screening mammogram for malignant neoplasm of breast: Secondary | ICD-10-CM

## 2015-09-16 ENCOUNTER — Ambulatory Visit
Admission: RE | Admit: 2015-09-16 | Discharge: 2015-09-16 | Disposition: A | Discharge status: Home / Self Care | Payer: BC Managed Care – PPO | Source: Ambulatory Visit

## 2015-09-16 DIAGNOSIS — Z0000 Encounter for general adult medical examination without abnormal findings: Secondary | ICD-10-CM

## 2015-09-16 DIAGNOSIS — Z1231 Encounter for screening mammogram for malignant neoplasm of breast: Secondary | ICD-10-CM

## 2015-10-11 ENCOUNTER — Other Ambulatory Visit: Payer: Self-pay | Admitting: Obstetrics and Gynecology

## 2015-10-12 LAB — CYTOLOGY - PAP

## 2016-02-27 ENCOUNTER — Telehealth: Payer: Self-pay | Admitting: Internal Medicine

## 2016-02-27 NOTE — Telephone Encounter (Signed)
Attempted to contact pt. No answer, no option to leave a message. Will try back.  

## 2016-02-28 NOTE — Telephone Encounter (Signed)
LM for pt

## 2016-02-28 NOTE — Telephone Encounter (Signed)
Spoke with pt. She wanted her appointment with MW moved up. OV has been moved to 03/02/2016. Nothing further was needed.

## 2016-02-28 NOTE — Telephone Encounter (Signed)
Pt returning call.Sandy Bennett ° °

## 2016-03-02 ENCOUNTER — Encounter: Payer: Self-pay | Admitting: Internal Medicine

## 2016-03-02 ENCOUNTER — Ambulatory Visit (INDEPENDENT_AMBULATORY_CARE_PROVIDER_SITE_OTHER)
Admission: RE | Admit: 2016-03-02 | Discharge: 2016-03-02 | Disposition: A | Discharge status: Home / Self Care | Payer: BC Managed Care – PPO | Source: Ambulatory Visit | Attending: Internal Medicine | Admitting: Internal Medicine

## 2016-03-02 ENCOUNTER — Ambulatory Visit (INDEPENDENT_AMBULATORY_CARE_PROVIDER_SITE_OTHER): Payer: BC Managed Care – PPO | Admitting: Internal Medicine

## 2016-03-02 ENCOUNTER — Other Ambulatory Visit (INDEPENDENT_AMBULATORY_CARE_PROVIDER_SITE_OTHER): Payer: BC Managed Care – PPO

## 2016-03-02 VITALS — BP 144/78 | HR 94 | Ht 67.5 in | Wt 186.0 lb

## 2016-03-02 DIAGNOSIS — Z0000 Encounter for general adult medical examination without abnormal findings: Secondary | ICD-10-CM

## 2016-03-02 DIAGNOSIS — D568 Other thalassemias: Secondary | ICD-10-CM

## 2016-03-02 DIAGNOSIS — E559 Vitamin D deficiency, unspecified: Secondary | ICD-10-CM

## 2016-03-02 DIAGNOSIS — R0789 Other chest pain: Secondary | ICD-10-CM

## 2016-03-02 DIAGNOSIS — E785 Hyperlipidemia, unspecified: Secondary | ICD-10-CM

## 2016-03-02 DIAGNOSIS — Z23 Encounter for immunization: Secondary | ICD-10-CM | POA: Diagnosis not present

## 2016-03-02 DIAGNOSIS — R079 Chest pain, unspecified: Secondary | ICD-10-CM

## 2016-03-02 LAB — LIPID PANEL
CHOLESTEROL: 172 mg/dL (ref 0–200)
HDL: 57.9 mg/dL (ref >39.00)
LDL CALC: 103 mg/dL — AB (ref 0–99)
NonHDL: 114.54
TRIGLYCERIDES: 58 mg/dL (ref 0.0–149.0)
Total CHOL/HDL Ratio: 3
VLDL: 11.6 mg/dL (ref 0.0–40.0)

## 2016-03-02 LAB — CBC WITH DIFFERENTIAL/PLATELET
BASOS ABS: 0 10*3/uL (ref 0.0–0.1)
Basophils Relative: 0.5 % (ref 0.0–3.0)
EOS PCT: 1.4 % (ref 0.0–5.0)
Eosinophils Absolute: 0.1 10*3/uL (ref 0.0–0.7)
HEMATOCRIT: 39.6 % (ref 36.0–46.0)
Hemoglobin: 12.3 g/dL (ref 12.0–15.0)
LYMPHS ABS: 2 10*3/uL (ref 0.7–4.0)
LYMPHS PCT: 46.6 % — AB (ref 12.0–46.0)
MCHC: 30.9 g/dL (ref 30.0–36.0)
MCV: 72.5 fl — AB (ref 78.0–100.0)
MONOS PCT: 11.4 % (ref 3.0–12.0)
Monocytes Absolute: 0.5 10*3/uL (ref 0.1–1.0)
NEUTROS ABS: 1.7 10*3/uL (ref 1.4–7.7)
Neutrophils Relative %: 40.1 % — ABNORMAL LOW (ref 43.0–77.0)
PLATELETS: 229 10*3/uL (ref 150.0–400.0)
RBC: 5.47 Mil/uL — AB (ref 3.87–5.11)
RDW: 14.7 % (ref 11.5–15.5)
WBC: 4.3 10*3/uL (ref 4.0–10.5)

## 2016-03-02 LAB — URINALYSIS
BILIRUBIN URINE: NEGATIVE
HGB URINE DIPSTICK: NEGATIVE
Ketones, ur: NEGATIVE
Leukocytes, UA: NEGATIVE
Nitrite: NEGATIVE
TOTAL PROTEIN, URINE-UPE24: NEGATIVE
URINE GLUCOSE: NEGATIVE
Urobilinogen, UA: 0.2 (ref 0.0–1.0)
pH: 6 (ref 5.0–8.0)

## 2016-03-02 LAB — HEPATIC FUNCTION PANEL
ALT: 25 U/L (ref 0–35)
AST: 25 U/L (ref 0–37)
Albumin: 4.4 g/dL (ref 3.5–5.2)
Alkaline Phosphatase: 70 U/L (ref 39–117)
BILIRUBIN DIRECT: 0.1 mg/dL (ref 0.0–0.3)
BILIRUBIN TOTAL: 0.3 mg/dL (ref 0.2–1.2)
Total Protein: 8.3 g/dL (ref 6.0–8.3)

## 2016-03-02 LAB — BASIC METABOLIC PANEL
BUN: 11 mg/dL (ref 6–23)
CO2: 29 mEq/L (ref 19–32)
Calcium: 9.9 mg/dL (ref 8.4–10.5)
Chloride: 105 mEq/L (ref 96–112)
Creatinine, Ser: 0.74 mg/dL (ref 0.40–1.20)
GFR: 100.89 mL/min (ref >60.00)
Glucose, Bld: 98 mg/dL (ref 70–99)
Potassium: 4.2 mEq/L (ref 3.5–5.1)
Sodium: 141 mEq/L (ref 135–145)

## 2016-03-02 LAB — TSH: TSH: 2.04 u[IU]/mL (ref 0.35–4.50)

## 2016-03-02 NOTE — Progress Notes (Signed)
Quick Note:  Spoke with pt and notified of results per Dr. Wert. Pt verbalized understanding and denied any questions.  ______ 

## 2016-03-02 NOTE — Progress Notes (Signed)
Subjective:     Patient ID: Sandy Bennett, female   DOB: 1951/03/10    MRN: [id number]   Brief patient profile:  1 yobf quit smoking around 1985 with h/o mod obesity, hyperlipidemia    History of Present Illness   10/26/2014 f/u ov/Omarrion Carmer re: cpx  Chief Complaint  Patient presents with  . Annual Exam    Pt is fasting. She still c/o occ cough- sometimes prod with white sputum.    Cough typically lasts up to 3 weeks after a "cold" maybe once or twice a year but not with season changes  rec Whenever you have any flare of respiratory symptoms > take pepcid ac 20 mg after bfast and supper until completely gone for a week      03/02/2016  f/u ov/Argus Caraher re:  Chief Complaint  Patient presents with  . Annual Exam    Pt is fasting today. Pt c/o tension and tightness in her neck, fatigue, and anxiety for the past month or so. She also c/o occ CP- comes and goes.    new onset cp x one month/ center to R of center middle of chest/ knife like. Shortest 30 sec to one min No diaph/nausea/radiation/sob / less than once a week/ never with exertion    No obvious day to day or daytime variabilty or assoc  sob or chest tightness, subjective wheeze overt sinus or hb symptoms. No unusual exp hx or h/o childhood pna/ asthma or knowledge of premature birth.  Sleeping ok without nocturnal  or early am exacerbation  of respiratory  c/o's or need for noct saba. Also denies any obvious fluctuation of symptoms with weather or environmental changes or other aggravating or alleviating factors except as outlined above   Current Medications, Allergies, Complete Past Medical History, Past Surgical History, Family History, and Social History were reviewed in [facility] record.  ROS  The following are not active complaints unless bolded sore throat, dysphagia, dental problems, itching, sneezing,  nasal congestion or excess/ purulent secretions, ear ache,   fever, chills, sweats, unintended wt  loss, pleuritic or exertional cp, hemoptysis,  orthopnea pnd or leg swelling, presyncope, palpitations, heartburn, abdominal pain, anorexia, nausea, vomiting, diarrhea  or change in bowel or urinary habits, change in stools or urine, dysuria,hematuria,  rash, arthralgias, visual complaints, headache, numbness weakness or ataxia or problems with walking or coordination,  change in mood/affect or memory.            Past Medical History:  Hyperlipidemia  - Target < 160 (no additional risk factors)  Mild Obesity  - Target wt = 190 for BMI < 30, < 171 ideal  Health Maintenance....................................................................Marland KitchenWert  - pneumovax 01/25/2014 - prevnar 13 03/02/2016  - Td 10/08/2013  - CPX 03/02/2016  - DEXA wnl  06/12/2010 - GYN  [name]  MVP, needs dental prophylaxis  - See Echo 03/12/95  Diverticulosis  - Colonoscopy 12/19/2007     Past Surgical History:  s/p cholecystectomy 1994  s/p partial hysterectomy 1970's benign  s/p carpal tunnel surgery Right hand 06/2009    Family History:  Cancer mother ? cx  brothers and sisters all healthy, only one full (sister) - she is the younger of full sister, pt older than others unsure of father's       Objective:   Physical Exam    wt 196 December 23, 2008 > 198 March 30, 2009 > 200 July 05, 2009 > 196 April 18, 2010 > 191 09/11/2011  >188  02/15/2012 > 09/12/2012  195>     194 06/01/2013 > 10/08/2013 191 > 01/25/2014 192 >190 02/12/2014 > 10/26/2014  191 > 03/02/2016   186   Ambulatory anxious appearing bf nad in no acute distress with freq throat clearing / pseudowheeze when coughing only    HEENT: upper dentures, nl turbinates, and orophanx.  Ear canals wax R > L  Neck without JVD/Nodes/TM, nl carotids with no bruits  Lungs clear to A and P bilaterally without cough on insp or exp maneuvers  RRR no s3 or murmur or increase in P2 no edema decreased sym pulses both feet  Abd soft and benign with nl excursion in the supine  position. No bruits or organomegaly   Ext warm without calf tenderness, cyanosis clubbing/     nml hand grips ,   Neuro : intact sensorium, nl gait, no motor deficits      CXR PA and Lateral:   03/02/2016 :    I personally reviewed images and agree with radiology impression as follows:    No active cardiopulmonary disease.  Labs ordered/ reviewed:     Chemistry      Component Value Date/Time   NA 141 03/02/2016 1243   K 4.2 03/02/2016 1243   CL 105 03/02/2016 1243   CO2 29 03/02/2016 1243   BUN 11 03/02/2016 1243   CREATININE 0.74 03/02/2016 1243      Component Value Date/Time   CALCIUM 9.9 03/02/2016 1243   ALKPHOS 70 03/02/2016 1243   AST 25 03/02/2016 1243   ALT 25 03/02/2016 1243   BILITOT 0.3 03/02/2016 1243        Lab Results  Component Value Date   WBC 4.3 03/02/2016   HGB 12.3 03/02/2016   HCT 39.6 03/02/2016   MCV 72.5* 03/02/2016   PLT 229.0 03/02/2016        Lab Results  Component Value Date   TSH 2.04 03/02/2016        ekg 03/02/2016 nsr/ wnl           Assessment:

## 2016-03-02 NOTE — Patient Instructions (Addendum)
If pain gets worse take pepcid ac 20 mg after bfast and supper x 2 week trial and if not helping call me for further eval  GERD (REFLUX)  is an extremely common cause of respiratory symptoms just like yours , many times with no obvious heartburn at all.    It can be treated with medication, but also with lifestyle changes including elevation of the head of your bed (ideally with 6 inch  bed blocks),  Smoking cessation, avoidance of late meals, excessive alcohol, and avoid fatty foods, chocolate, peppermint, colas, red wine, and acidic juices such as orange juice.  NO MINT OR MENTHOL PRODUCTS SO NO COUGH DROPS  USE SUGARLESS CANDY INSTEAD (Jolley ranchers or Stover's or Life Savers) or even ice chips will also do - the key is to swallow to prevent all throat clearing. NO OIL BASED VITAMINS - use powdered substitutes.  prevnar 13 today   Centrum A to Z one daily   Please schedule a follow up visit in 12 months but call sooner if needed

## 2016-03-04 ENCOUNTER — Encounter: Payer: Self-pay | Admitting: Internal Medicine

## 2016-03-04 DIAGNOSIS — R0789 Other chest pain: Secondary | ICD-10-CM

## 2016-03-04 DIAGNOSIS — R079 Chest pain, unspecified: Secondary | ICD-10-CM | POA: Insufficient documentation

## 2016-03-04 NOTE — Assessment & Plan Note (Signed)
Pattern is non-specific/ fleeting > advised on possibility related to GERD or GI but not cardiac and to call if more freq/severe on gerd /gas diet and prn pepcid

## 2016-03-04 NOTE — Assessment & Plan Note (Signed)
-   Target  < 160 borerline hbp with no additional risk factors     Lab Results  Component Value Date   CHOL 172 03/02/2016   HDL 57.90 03/02/2016   LDLCALC 103* 03/02/2016   TRIG 58.0 03/02/2016   CHOLHDL 3 03/02/2016     Adequate control on present rx, reviewed > no change in rx needed  = diet / ext

## 2016-03-04 NOTE — Assessment & Plan Note (Signed)
No change hct/ no change MCV vs previous results/ no further w/u needed

## 2016-03-04 NOTE — Assessment & Plan Note (Signed)
-   mammos 04/18/2015 Benign left breast calcifications compatible with calcifications associated with a small, degenerated fibroadenoma.  - mammos  09/16/15 neg   All gyn per [name]/ due prevnar > given All other issues UTD except for bone density > ordered

## 2016-03-05 LAB — VITAMIN D 1,25 DIHYDROXY
Vitamin D 1, 25 (OH)2 Total: 55 pg/mL (ref 18–72)
Vitamin D3 1, 25 (OH)2: 55 pg/mL

## 2016-03-06 ENCOUNTER — Telehealth: Payer: Self-pay | Admitting: *Deleted

## 2016-03-06 DIAGNOSIS — E559 Vitamin D deficiency, unspecified: Secondary | ICD-10-CM

## 2016-03-06 NOTE — Progress Notes (Signed)
Quick Note:  LMTCB ______ 

## 2016-03-06 NOTE — Telephone Encounter (Signed)
-----   Message from Nyoka CowdenMichael B Wert, MD sent at 03/04/2016  6:57 AM EDT ----- Due bone densitometry this year, would go ahead and schedule unless done by GYN

## 2016-03-06 NOTE — Telephone Encounter (Signed)
LMTCB

## 2016-03-07 NOTE — Telephone Encounter (Signed)
[phone] pt calling back

## 2016-03-07 NOTE — Telephone Encounter (Signed)
Order placed for DEXA. Pt aware. Nothing further needed.

## 2016-03-14 ENCOUNTER — Ambulatory Visit (INDEPENDENT_AMBULATORY_CARE_PROVIDER_SITE_OTHER)
Admission: RE | Admit: 2016-03-14 | Discharge: 2016-03-14 | Disposition: A | Discharge status: Home / Self Care | Payer: BC Managed Care – PPO | Source: Ambulatory Visit | Attending: Internal Medicine | Admitting: Internal Medicine

## 2016-03-14 DIAGNOSIS — E559 Vitamin D deficiency, unspecified: Secondary | ICD-10-CM | POA: Diagnosis not present

## 2016-03-15 DIAGNOSIS — E559 Vitamin D deficiency, unspecified: Secondary | ICD-10-CM | POA: Diagnosis not present

## 2016-03-15 NOTE — Progress Notes (Signed)
Quick Note:  Spoke with pt and notified of results per Dr. Wert. Pt verbalized understanding and denied any questions.  ______ 

## 2016-03-16 ENCOUNTER — Ambulatory Visit: Payer: BC Managed Care – PPO | Admitting: Internal Medicine

## 2016-07-03 ENCOUNTER — Telehealth: Payer: Self-pay | Admitting: Internal Medicine

## 2016-07-03 NOTE — Telephone Encounter (Signed)
Spoke with pt. She wants to have a flu shot here. Pt has been scheduled to get her flu shot tomorrow. Nothing further was needed.

## 2016-07-04 ENCOUNTER — Ambulatory Visit (INDEPENDENT_AMBULATORY_CARE_PROVIDER_SITE_OTHER): Payer: Medicare Other

## 2016-07-04 DIAGNOSIS — Z23 Encounter for immunization: Secondary | ICD-10-CM | POA: Diagnosis not present

## 2016-10-08 ENCOUNTER — Other Ambulatory Visit: Payer: Self-pay | Admitting: Internal Medicine

## 2016-10-08 DIAGNOSIS — Z1231 Encounter for screening mammogram for malignant neoplasm of breast: Secondary | ICD-10-CM

## 2016-10-19 ENCOUNTER — Ambulatory Visit
Admission: RE | Admit: 2016-10-19 | Discharge: 2016-10-19 | Disposition: A | Discharge status: Home / Self Care | Payer: Medicare Other | Source: Ambulatory Visit | Attending: Internal Medicine | Admitting: Internal Medicine

## 2016-10-19 DIAGNOSIS — Z1231 Encounter for screening mammogram for malignant neoplasm of breast: Secondary | ICD-10-CM

## 2016-11-05 ENCOUNTER — Telehealth: Payer: Self-pay | Admitting: Internal Medicine

## 2016-11-05 NOTE — Telephone Encounter (Signed)
In absence of purulent sputum or fever no need for ov or rx but if not better by end of week needs to see me or one of the NP's

## 2016-11-05 NOTE — Telephone Encounter (Signed)
Spoke with pt. States that she has come down with a cold. Reports runny/stuffy nose, hoarseness and slight cough. Cough is producing clear mucus. Denies sore throat, fever/chills/sweats, wheezing, chest tightness or SOB. Symptoms started on Friday. Has been taking Cold-Ez OTC with minimal relief. Would like MW's recommendations.  MW - please advise. Thanks.

## 2016-11-05 NOTE — Telephone Encounter (Signed)
Pt aware of MW's recommendations & voiced her understanding. Nothing further needed.  

## 2016-11-12 ENCOUNTER — Other Ambulatory Visit (INDEPENDENT_AMBULATORY_CARE_PROVIDER_SITE_OTHER): Payer: Medicare Other

## 2016-11-12 ENCOUNTER — Telehealth: Payer: Self-pay | Admitting: Internal Medicine

## 2016-11-12 ENCOUNTER — Encounter: Payer: Self-pay | Admitting: Adult Health

## 2016-11-12 ENCOUNTER — Ambulatory Visit (INDEPENDENT_AMBULATORY_CARE_PROVIDER_SITE_OTHER): Payer: Medicare Other | Admitting: Adult Health

## 2016-11-12 VITALS — BP 136/78 | HR 78 | Ht 67.5 in | Wt 175.8 lb

## 2016-11-12 DIAGNOSIS — J069 Acute upper respiratory infection, unspecified: Secondary | ICD-10-CM | POA: Insufficient documentation

## 2016-11-12 DIAGNOSIS — J029 Acute pharyngitis, unspecified: Secondary | ICD-10-CM

## 2016-11-12 DIAGNOSIS — R05 Cough: Secondary | ICD-10-CM

## 2016-11-12 DIAGNOSIS — R059 Cough, unspecified: Secondary | ICD-10-CM

## 2016-11-12 LAB — CBC WITH DIFFERENTIAL/PLATELET
BASOS PCT: 0.5 % (ref 0.0–3.0)
Basophils Absolute: 0 10*3/uL (ref 0.0–0.1)
EOS ABS: 0.1 10*3/uL (ref 0.0–0.7)
EOS PCT: 1.6 % (ref 0.0–5.0)
HEMATOCRIT: 38 % (ref 36.0–46.0)
HEMOGLOBIN: 12 g/dL (ref 12.0–15.0)
LYMPHS PCT: 36 % (ref 12.0–46.0)
Lymphs Abs: 1.3 10*3/uL (ref 0.7–4.0)
MCHC: 31.5 g/dL (ref 30.0–36.0)
MCV: 72 fl — ABNORMAL LOW (ref 78.0–100.0)
Monocytes Absolute: 0.8 10*3/uL (ref 0.1–1.0)
Monocytes Relative: 16 % — ABNORMAL HIGH (ref 3.0–12.0)
Neutro Abs: 1.6 10*3/uL (ref 1.4–7.7)
Neutrophils Relative %: 47 % (ref 43.0–77.0)
Platelets: 237 10*3/uL (ref 150.0–400.0)
RBC: 5.28 Mil/uL — ABNORMAL HIGH (ref 3.87–5.11)
RDW: 14.7 % (ref 11.5–15.5)
WBC: 3.7 10*3/uL — AB (ref 4.0–10.5)

## 2016-11-12 LAB — BETA STREP SCREEN: Streptococcus, Group A Screen (Direct): NEGATIVE

## 2016-11-12 MED ORDER — AZITHROMYCIN 250 MG PO TABS: ORAL_TABLET | ORAL | 0 refills | Status: DC

## 2016-11-12 NOTE — Patient Instructions (Addendum)
Zpack take as directed.  Claritin 10mg  daily for drainage As needed   Tylenol As needed   Labs today , strep test .  follow up in 2-3 weeks and As needed   Please contact office for sooner follow up if symptoms do not improve or worsen or seek emergency care   Follow up Dr. Sherene SiresWert  As planned and As needed

## 2016-11-12 NOTE — Progress Notes (Signed)
 @Patient  ID: Sandy LieuPhyllis W Bennett, female    DOB: 16-Jan-1951, 66 y.o.   MRN: [id number]  Chief Complaint  Patient presents with  . Acute Visit    sinus congesiton     Referring provider: Nyoka CowdenWert, Michael B, MD  HPI: 66 yo female former smoker   11/12/2016 Acute OV  Pt presents for an acute office visit. Complains of 1 week of cough , congestion , drainage , minimally productive cough with thick white mucus, swollen and tender glands.  Ears are itchy .  No fever. Took Cold eeze last week.  Daughter has been sick with viral illness .Marland Kitchen.    No Known Allergies  Immunization History  Administered Date(s) Administered  . Influenza Split 06/24/2013, 06/24/2014  . Influenza Whole 06/25/2011, 07/14/2012  . Influenza, High Dose Seasonal PF 07/04/2016  . PPD Test 03/18/2012  . Pneumococcal Conjugate-13 03/02/2016  . Td 10/26/2003  . Tdap 10/08/2013    Past Medical History:  Diagnosis Date  . Diverticulosis   . Hyperlipidemia   . Mild obesity   . MVP (mitral valve prolapse)     Tobacco History: History  Smoking Status  . Former Smoker  . Packs/day: 0.50  . Years: 10.00  . Types: Cigarettes  . Quit date: 09/25/1983  Smokeless Tobacco  . Never Used   Counseling given: Not Answered   Outpatient Encounter Prescriptions as of 11/12/2016  Medication Sig  . azithromycin (ZITHROMAX) 250 MG tablet Take as directed   No facility-administered encounter medications on file as of 11/12/2016.      Review of Systems  Constitutional:   No  weight loss, night sweats,  Fevers, chills,  +fatigue, or  lassitude.  HEENT:   No headaches,  Difficulty swallowing,  Tooth/dental problems, or  Sore throat,                No sneezing,  +itching, ear , nasal congestion, post nasal drip,   CV:  No chest pain,  Orthopnea, PND, swelling in lower extremities, anasarca, dizziness, palpitations, syncope.   GI  No heartburn, indigestion, abdominal pain, nausea, vomiting, diarrhea, change in bowel  habits, loss of appetite, bloody stools.   Resp:    No wheezing.  No chest wall deformity  Skin: no rash or lesions.  GU: no dysuria, change in color of urine, no urgency or frequency.  No flank pain, no hematuria   MS:  No joint pain or swelling.  No decreased range of motion.  No back pain.    Physical Exam  BP 136/78 (BP Location: Left Arm, Cuff Size: Normal)   Pulse 78   Ht 5' 7.5" (1.715 m)   Wt 79.7 kg (175 lb 12.8 oz)   SpO2 99%   BMI 27.13 kg/m   GEN: A/Ox3; pleasant , NAD    HEENT:  [place]/AT,  EACs-clear, TMs-wnl, NOSE-clear drainage , THROAT-clear, no lesions, clear drainage , no thrush . Uvula midline w/ out selling. Tongue without swelling . Marland Kitchen. Upper dentures, poor dentition along bottom. Lips normal w/ no swelling. Strong voice.   NECK:  Supple w/ fair ROM; no JVD; normal carotid impulses w/o bruits; no thyromegaly or nodules palpated; bilateral anterior cervical adenopathy , no stridor   RESP  Clear  P & A; w/o, wheezes/ rales/ or rhonchi. no accessory muscle use, no dullness to percussion  CARD:  RRR, no m/r/g, no peripheral edema, pulses intact, no cyanosis or clubbing.  GI:   Soft & nt; nml bowel sounds; no organomegaly or  masses detected.   Musco: Warm bil, no deformities or joint swelling noted.   Neuro: alert, no focal deficits noted.    Skin: Warm, no lesions or rashes    Lab Results:  CBC    Component Value Date/Time   WBC 4.3 03/02/2016 1243   RBC 5.47 (H) 03/02/2016 1243   HGB 12.3 03/02/2016 1243   HCT 39.6 03/02/2016 1243   PLT 229.0 03/02/2016 1243   MCV 72.5 (L) 03/02/2016 1243   MCHC 30.9 03/02/2016 1243   RDW 14.7 03/02/2016 1243   LYMPHSABS 2.0 03/02/2016 1243   MONOABS 0.5 03/02/2016 1243   EOSABS 0.1 03/02/2016 1243   BASOSABS 0.0 03/02/2016 1243    BMET    Component Value Date/Time   NA 141 03/02/2016 1243   K 4.2 03/02/2016 1243   CL 105 03/02/2016 1243   CO2 29 03/02/2016 1243   GLUCOSE 98 03/02/2016 1243   BUN 11  03/02/2016 1243   CREATININE 0.74 03/02/2016 1243   CALCIUM 9.9 03/02/2016 1243   GFRNONAA 102.81 04/18/2010 0920    BNP No results found for: BNP  ProBNP No results found for: PROBNP  Imaging: Mm Digital Screening Bilateral  Result Date: 10/19/2016 CLINICAL DATA:  Screening. EXAM: DIGITAL SCREENING BILATERAL MAMMOGRAM WITH CAD COMPARISON:  Previous exam(s). ACR Breast Density Category b: There are scattered areas of fibroglandular density. FINDINGS: There are no findings suspicious for malignancy. Images were processed with CAD. IMPRESSION: No mammographic evidence of malignancy. A result letter of this screening mammogram will be mailed directly to the patient. RECOMMENDATION: Screening mammogram in one year. (Code:SM-B-01Y) BI-RADS CATEGORY  1: Negative. Electronically Signed   By: Sherian Rein M.D.   On: 10/19/2016 10:39     Assessment & Plan:   Acute URI URI +/- sinusitis/bronchitis with cervical adenopathy  Check strep and CBC.   Plan  . Patient Instructions  Zpack take as directed.  Claritin 10mg  daily for drainage As needed   Tylenol As needed   Labs today , strep test .  follow up in 2-3 weeks and As needed   Please contact office for sooner follow up if symptoms do not improve or worsen or seek emergency care   Follow up Dr. Sherene Sires  As planned and As needed          Rubye Oaks, NP 11/12/2016

## 2016-11-12 NOTE — Assessment & Plan Note (Signed)
URI +/- sinusitis/bronchitis with cervical adenopathy  Check strep and CBC.   Plan  . Patient Instructions  Zpack take as directed.  Claritin 10mg  daily for drainage As needed   Tylenol As needed   Labs today , strep test .  follow up in 2-3 weeks and As needed   Please contact office for sooner follow up if symptoms do not improve or worsen or seek emergency care   Follow up Dr. Sherene SiresWert  As planned and As needed

## 2016-11-12 NOTE — Telephone Encounter (Signed)
Spoke with pt. She called last week and was instructed by MW if she didn't feel better by this week, she would need an OV. Pt has been scheduled to see TP this morning at 9:30am. Nothing further was needed.

## 2016-11-13 NOTE — Progress Notes (Signed)
Chart and office note reviewed in detail  > agree with a/p as outlined    

## 2016-11-15 NOTE — Progress Notes (Signed)
Spoke with pt and notified of results per TP. Pt verbalized understanding and denied any questions. 

## 2017-01-29 ENCOUNTER — Ambulatory Visit (INDEPENDENT_AMBULATORY_CARE_PROVIDER_SITE_OTHER): Payer: Medicare Other

## 2017-01-29 ENCOUNTER — Encounter: Payer: Self-pay | Admitting: Internal Medicine

## 2017-01-29 ENCOUNTER — Ambulatory Visit (INDEPENDENT_AMBULATORY_CARE_PROVIDER_SITE_OTHER): Payer: Medicare Other | Admitting: Orthopaedic Surgery

## 2017-01-29 ENCOUNTER — Encounter (INDEPENDENT_AMBULATORY_CARE_PROVIDER_SITE_OTHER): Payer: Self-pay | Admitting: Orthopaedic Surgery

## 2017-01-29 DIAGNOSIS — M79672 Pain in left foot: Secondary | ICD-10-CM

## 2017-01-29 MED ORDER — DICLOFENAC SODIUM 1 % TD GEL: 2.0000 g | Freq: Four times a day (QID) | TRANSDERMAL | 3 refills | Status: DC

## 2017-01-29 NOTE — Progress Notes (Signed)
Office Visit Note   Patient: Sandy Bennett           Date of Birth: 1951/02/09           MRN: [id number] Visit Date: 01/29/2017              Requested by: Nyoka CowdenWert, Michael B, MD [address] [place], [place] [id number] PCP: Nyoka CowdenWert, Michael B, MD   Assessment & Plan: Visit Diagnoses:  1. Pain in left foot     Plan: Regular try combination of activity modification, a postop shoe, and Voltaren gel to see if we can get this acute problem to calm down. We'll see her back in 3-4 weeks for repeat exam.  Follow-Up Instructions: Return in about 4 weeks (around 02/26/2017).   Orders:  Orders Placed This Encounter  Procedures  . XR Foot Complete Left   Meds ordered this encounter  Medications  . diclofenac sodium (VOLTAREN) 1 % GEL    Sig: Apply 2 g topically 4 (four) times daily.    Dispense:  100 g    Refill:  3      Procedures: No procedures performed   Clinical Data: No additional findings.   Subjective: Chief Complaint  Patient presents with  . Left Foot - Pain   The patient comes in with chief complaint of left foot pain. Is been slowly worsening for about 3 weeks now with no known injury. She's been having some swelling on the top her foot as well. She does work out but no high impact aerobic activities. She denies any numbness and tingling in her foot. It is consistent pain that is aggravating. It's about 6 out of 10 in terms of his quality and intensity. She denies any other orthopedic problems or other issues as it relates to her chief complaint of left foot pain. HPI  Review of Systems She currently denies any fever, chills, headache, shortness of breath, chest pain, nausea, vomiting.  Objective: Vital Signs: There were no vitals taken for this visit.  Physical Exam She is alert and oriented 3 and in no acute distress Ortho Exam Examination of her left foot shows full range of motion of her ankle joint and her midfoot. She has have midfoot swelling  dorsally. There is no redness. She's neurovascularly intact. She does have pain to deep palpation around the navicular aspect of the foot. There is no pain on the plantar aspect and no wounds or skin abnormalities. Specialty Comments:  No specialty comments available.  Imaging: Xr Foot Complete Left  Result Date: 01/29/2017 3 views of her left foot showed no acute findings. There is no irregularities of the bones or the joints. There is soft tissue swelling dorsally at the midfoot.    PMFS History: Patient Active Problem List   Diagnosis Date Noted  . Acute URI 11/12/2016  . Chest pain of uncertain etiology 03/04/2016  . Cough 01/25/2014  . Healthcare maintenance 10/08/2013  . TMJ (temporomandibular joint syndrome) 05/27/2013  . Paresthesia of skin 02/15/2012  . Essential hypertension 07/27/2011  . Vitamin D deficiency 03/30/2009  . Other thalassemia (HCC) 03/30/2009  . Hyperlipidemia LDL goal <160 11/14/2007  . DIVERTICULOSIS OF COLON 11/14/2007  . MITRAL VALVE PROLAPSE, HX OF 11/14/2007   Past Medical History:  Diagnosis Date  . Diverticulosis   . Hyperlipidemia   . Mild obesity   . MVP (mitral valve prolapse)     Family History  Problem Relation Age of Onset  . Cancer Mother     ?  type  . Colon polyps Neg Hx   . Esophageal cancer Neg Hx   . Stomach cancer Neg Hx     Past Surgical History:  Procedure Laterality Date  . CARPAL TUNNEL RELEASE  2010   rt  . CHOLECYSTECTOMY  1994  . PARTIAL HYSTERECTOMY  1970  . TONSILLECTOMY     Social History   Occupational History  . Secretary [employer]   Social History Main Topics  . Smoking status: Former Smoker    Packs/day: 0.50    Years: 10.00    Types: Cigarettes    Quit date: 09/25/1983  . Smokeless tobacco: Never Used  . Alcohol use No  . Drug use: No  . Sexual activity: Not on file

## 2017-02-07 ENCOUNTER — Telehealth (INDEPENDENT_AMBULATORY_CARE_PROVIDER_SITE_OTHER): Payer: Self-pay

## 2017-02-07 NOTE — Telephone Encounter (Signed)
Patient would like another Rx, stated that Diclofenac is not covered by her insurance.  Please Advise.  CB# is [phone]. Thank You.

## 2017-02-07 NOTE — Telephone Encounter (Signed)
Pending PA for the Voltaren

## 2017-02-26 ENCOUNTER — Ambulatory Visit (INDEPENDENT_AMBULATORY_CARE_PROVIDER_SITE_OTHER): Payer: Medicare Other | Admitting: Orthopaedic Surgery

## 2017-03-11 ENCOUNTER — Ambulatory Visit (INDEPENDENT_AMBULATORY_CARE_PROVIDER_SITE_OTHER): Payer: Medicare Other | Admitting: Internal Medicine

## 2017-03-11 ENCOUNTER — Encounter: Payer: Self-pay | Admitting: Internal Medicine

## 2017-03-11 ENCOUNTER — Other Ambulatory Visit (INDEPENDENT_AMBULATORY_CARE_PROVIDER_SITE_OTHER): Payer: Medicare Other

## 2017-03-11 VITALS — BP 116/72 | HR 72 | Ht 67.5 in | Wt 171.0 lb

## 2017-03-11 DIAGNOSIS — K5730 Diverticulosis of large intestine without perforation or abscess without bleeding: Secondary | ICD-10-CM

## 2017-03-11 DIAGNOSIS — Z0000 Encounter for general adult medical examination without abnormal findings: Secondary | ICD-10-CM

## 2017-03-11 DIAGNOSIS — R05 Cough: Secondary | ICD-10-CM | POA: Diagnosis not present

## 2017-03-11 DIAGNOSIS — I10 Essential (primary) hypertension: Secondary | ICD-10-CM

## 2017-03-11 DIAGNOSIS — D568 Other thalassemias: Secondary | ICD-10-CM | POA: Diagnosis not present

## 2017-03-11 DIAGNOSIS — E785 Hyperlipidemia, unspecified: Secondary | ICD-10-CM

## 2017-03-11 DIAGNOSIS — R059 Cough, unspecified: Secondary | ICD-10-CM

## 2017-03-11 LAB — CBC WITH DIFFERENTIAL/PLATELET
BASOS ABS: 0 10*3/uL (ref 0.0–0.1)
BASOS PCT: 0.7 % (ref 0.0–3.0)
EOS ABS: 0.1 10*3/uL (ref 0.0–0.7)
Eosinophils Relative: 1.8 % (ref 0.0–5.0)
HEMATOCRIT: 42.2 % (ref 36.0–46.0)
HEMOGLOBIN: 13.2 g/dL (ref 12.0–15.0)
LYMPHS PCT: 46.5 % — AB (ref 12.0–46.0)
Lymphs Abs: 1.4 10*3/uL (ref 0.7–4.0)
MCHC: 31.2 g/dL (ref 30.0–36.0)
MCV: 72.8 fl — ABNORMAL LOW (ref 78.0–100.0)
Monocytes Absolute: 0.4 10*3/uL (ref 0.1–1.0)
Monocytes Relative: 13.9 % — ABNORMAL HIGH (ref 3.0–12.0)
NEUTROS ABS: 1.1 10*3/uL — AB (ref 1.4–7.7)
Neutrophils Relative %: 37.1 % — ABNORMAL LOW (ref 43.0–77.0)
PLATELETS: 209 10*3/uL (ref 150.0–400.0)
RBC: 5.79 Mil/uL — ABNORMAL HIGH (ref 3.87–5.11)
RDW: 14.3 % (ref 11.5–15.5)
WBC: 3 10*3/uL — AB (ref 4.0–10.5)

## 2017-03-11 LAB — HEPATIC FUNCTION PANEL
ALK PHOS: 87 U/L (ref 39–117)
ALT: 25 U/L (ref 0–35)
AST: 24 U/L (ref 0–37)
Albumin: 4.5 g/dL (ref 3.5–5.2)
BILIRUBIN DIRECT: 0 mg/dL (ref 0.0–0.3)
BILIRUBIN TOTAL: 0.3 mg/dL (ref 0.2–1.2)
Total Protein: 8.3 g/dL (ref 6.0–8.3)

## 2017-03-11 LAB — BASIC METABOLIC PANEL
BUN: 12 mg/dL (ref 6–23)
CALCIUM: 10.3 mg/dL (ref 8.4–10.5)
CO2: 29 mEq/L (ref 19–32)
CREATININE: 0.8 mg/dL (ref 0.40–1.20)
Chloride: 102 mEq/L (ref 96–112)
GFR: 91.92 mL/min (ref >60.00)
Glucose, Bld: 102 mg/dL — ABNORMAL HIGH (ref 70–99)
Potassium: 4.3 mEq/L (ref 3.5–5.1)
Sodium: 138 mEq/L (ref 135–145)

## 2017-03-11 LAB — LIPID PANEL
CHOL/HDL RATIO: 3
Cholesterol: 183 mg/dL (ref 0–200)
HDL: 59.7 mg/dL (ref >39.00)
LDL Cholesterol: 110 mg/dL — ABNORMAL HIGH (ref 0–99)
NONHDL: 123.29
TRIGLYCERIDES: 67 mg/dL (ref 0.0–149.0)
VLDL: 13.4 mg/dL (ref 0.0–40.0)

## 2017-03-11 LAB — TSH: TSH: 2.08 u[IU]/mL (ref 0.35–4.50)

## 2017-03-11 NOTE — Progress Notes (Signed)
Subjective:     Patient ID: Sandy Bennett, female   DOB: Nov 18, 1950    MRN: [id number]   Brief patient profile:  72 yobf quit smoking around 1985 with h/o mod obesity, hyperlipidemia    History of Present Illness   10/26/2014 f/u ov/Sandy Bennett re: cpx  Chief Complaint  Patient presents with  . Annual Exam    Pt is fasting. She still c/o occ cough- sometimes prod with white sputum.    Cough typically lasts up to 3 weeks after a "cold" maybe once or twice a year but not with season changes  rec Whenever you have any flare of respiratory symptoms > take pepcid ac 20 mg after bfast and supper until completely gone for a week    03/11/2017  f/u ov/Sandy Bennett re:  Obesity/ hyperlipidemia  Chief Complaint  Patient presents with  . Annual Exam    1 yr physical. Pt states she has been feeling and doing great since last visit.       Working out at gym 3 x weekly x 1 h s  sob  No obvious day to day or daytime variability in ex tol  or  ex or cp or chest tightness, subjective wheeze or overt sinus or hb symptoms. No unusual exp hx or h/o childhood pna/ asthma or knowledge of premature birth.  Sleeping ok without nocturnal  or early am exacerbation  of respiratory  c/o's or need for noct saba. Also denies any obvious fluctuation of symptoms with weather or environmental changes or other aggravating or alleviating factors except as outlined above   Current Medications, Allergies, Complete Past Medical History, Past Surgical History, Family History, and Social History were reviewed in [employer] record.  ROS  The following are not active complaints unless bolded sore throat, dysphagia, dental problems, itching, sneezing,  nasal congestion or excess/ purulent secretions, ear ache,   fever, chills, sweats, unintended wt loss, classically pleuritic or exertional cp,  orthopnea pnd or leg swelling, presyncope, palpitations, abdominal pain, anorexia, nausea, vomiting, diarrhea  or change  in bowel or bladder habits, change in stools or urine, dysuria,hematuria,  rash, arthralgias, visual complaints, headache, numbness, weakness or ataxia or problems with walking or coordination,  change in mood/affect or memory.                       Past Medical History:  Hyperlipidemia  - Target < 160 (no additional risk factors)  Mild Obesity  - Target wt = 190 for BMI < 30, < 171 ideal  Health Maintenance....................................................................Marland KitchenWert  - pneumovax 01/25/2014 - prevnar 13 03/02/2016  - Td 10/08/2013  - CPX 03/11/2017  - DEXA wnl  06/12/2010 - GYN  Sandy Bennett  MVP, needs dental prophylaxis  - See Echo 03/12/95  Diverticulosis  - Colonoscopy 12/19/2007   And 07/10/13  ......................................................... Sandy Bennett     Past Surgical History:  s/p cholecystectomy 1994  s/p partial hysterectomy 1970's benign  s/p carpal tunnel surgery Right hand 06/2009    Family History:  Cancer mother ? cx  brothers and sisters all healthy, only one full (sister) - she is the younger of full sister, pt older than others unsure of father's       Objective:   Physical Exam    wt 196 December 23, 2008 > 198 March 30, 2009 > 200 July 05, 2009 > 196 April 18, 2010 > 191 09/11/2011  >188 02/15/2012 > 09/12/2012  195>  194 06/01/2013 > 10/08/2013 191 > 01/25/2014 192 >190 02/12/2014 > 10/26/2014  191 > 03/02/2016   186  >  03/11/2017   171 (at target BMI)   Vital signs reviewed - - Note on arrival 02 sats  97% on RA    HEENT: nl  turbinates bilaterally, and oropharynx. Nl external ear canals without cough reflex - top dentures    NECK :  without JVD/Nodes/TM/ nl carotid upstrokes bilaterally   LUNGS: no acc muscle use,  Nl contour chest which is clear to A and P bilaterally without cough on insp or exp maneuvers   CV:  RRR  no s3 or murmur or increase in P2, and no edema   ABD:  soft and nontender with nl inspiratory excursion in the supine  position. No bruits or organomegaly appreciated, bowel sounds nl  MS:  Nl gait/ ext warm without deformities, calf tenderness, cyanosis or clubbing No obvious joint restrictions   SKIN: warm and dry without lesions    NEURO:  alert, approp, nl sensorium with  no motor or cerebellar deficits apparent.      Labs ordered/ reviewed:      Chemistry      Component Value Date/Time   NA 138 03/11/2017 1153   K 4.3 03/11/2017 1153   CL 102 03/11/2017 1153   CO2 29 03/11/2017 1153   BUN 12 03/11/2017 1153   CREATININE 0.80 03/11/2017 1153      Component Value Date/Time   CALCIUM 10.3 03/11/2017 1153   ALKPHOS 87 03/11/2017 1153   AST 24 03/11/2017 1153   ALT 25 03/11/2017 1153   BILITOT 0.3 03/11/2017 1153        Lab Results  Component Value Date   WBC 3.0 (L) 03/11/2017   HGB 13.2 03/11/2017   HCT 42.2 03/11/2017   MCV 72.8 (L) 03/11/2017   PLT 209.0 03/11/2017         Lab Results  Component Value Date   TSH 2.08 03/11/2017                     Assessment:

## 2017-03-11 NOTE — Progress Notes (Signed)
Spoke with Sandy Bennett and notified of results per Dr. Wert. Sandy Bennett verbalized understanding and denied any questions. 

## 2017-03-11 NOTE — Patient Instructions (Signed)
Please remember to go to the lab department downstairs in the basement  for your tests - we will call you with the results when they are available.      Please schedule a follow up visit in 12  months but call sooner if needed for CPX

## 2017-03-12 ENCOUNTER — Encounter: Payer: Self-pay | Admitting: Internal Medicine

## 2017-03-12 NOTE — Assessment & Plan Note (Signed)
Adequate control on present rx, reviewed in detail with pt > no change in rx needed   = diet/ ex 

## 2017-03-12 NOTE — Assessment & Plan Note (Signed)
-   mammos 04/18/2015 Benign left breast calcifications compatible with calcifications associated with a small, degenerated fibroadenoma.  - mammos  09/16/15 neg  - mammos 10/19/2016 neg   Reviewed/ utd with all female-specific health maint deferred to Dr Cher NakaiHarvath

## 2017-03-12 NOTE — Assessment & Plan Note (Signed)
No change in hct or mcv > no rx needed

## 2017-03-12 NOTE — Assessment & Plan Note (Signed)
-   Target  < 160 borerline hbp with no additional risk factors     Lab Results  Component Value Date   CHOL 183 03/11/2017   HDL 59.70 03/11/2017   LDLCALC 110 (H) 03/11/2017   TRIG 67.0 03/11/2017   CHOLHDL 3 03/11/2017    Excellent control with diet/ ex > no change rx

## 2017-03-12 NOTE — Assessment & Plan Note (Signed)
Resolved, reviewed need for gerd rx short term for any flare of cough based on prev tendency to cyclical coughing.

## 2017-03-12 NOTE — Assessment & Plan Note (Signed)
Colonoscopy 12/19/2007   And 07/10/13 >  due 2019/ in computer for recall

## 2017-05-31 ENCOUNTER — Telehealth: Payer: Self-pay | Admitting: Internal Medicine

## 2017-05-31 MED ORDER — AZITHROMYCIN 250 MG PO TABS: ORAL_TABLET | ORAL | 0 refills | Status: DC

## 2017-05-31 NOTE — Telephone Encounter (Signed)
Called and spoke with pt and she is aware of zpak that has been sent to the pharmacy.  Nothing further is needed. 

## 2017-05-31 NOTE — Telephone Encounter (Signed)
Pt c/o hoarseness, swollen lymph nodes, sore throat with redness X2 days.   Denies chest/sinus congestion, chest pain, HA, fever, chills, cough.    Pt has taken otc cough drops to soothe throat- requesting further recs.  [facility] [address]   Sending to DOD as MW is off today.  RA please advise.  Thanks!

## 2017-05-31 NOTE — Telephone Encounter (Signed)
Z pak

## 2017-06-06 ENCOUNTER — Telehealth: Payer: Self-pay | Admitting: Internal Medicine

## 2017-06-06 DIAGNOSIS — Z0000 Encounter for general adult medical examination without abnormal findings: Secondary | ICD-10-CM

## 2017-06-06 NOTE — Telephone Encounter (Signed)
Called pt to get more details. 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 Does pt have to come in for OV or can we just put in order and give her the TB without you seeing her? Please advise.

## 2017-06-06 NOTE — Telephone Encounter (Signed)
We don't do those anymore >  quantiferon GOLD TB is now the standard

## 2017-06-06 NOTE — Telephone Encounter (Signed)
Ok can I order this and just have her go to lab and draw? I just need to know if she has to come in for OV? If not I can tell her just to walk in. Thanks

## 2017-06-07 NOTE — Telephone Encounter (Signed)
No ov needed - if dx code is needed can use "health maint"

## 2017-06-10 NOTE — Telephone Encounter (Signed)
Spoke with patient. Asked patient if her job required the skin test or would the lab test be ok. She said that she was not sure but would call them and ask. Advised patient that since we do not do the TB skin tests, to check out the health department or an urgent care. She verbalized understanding. She stated that if the job stated that the lab draw would be ok, she would call back for [place] to place the order.   Will hold this message until she calls back.

## 2017-06-11 NOTE — Telephone Encounter (Signed)
Lab has been ordered. Pt is aware and voiced her understanding. Nothing further needed.

## 2017-06-11 NOTE — Telephone Encounter (Signed)
Patient stated per job it is ok to have lab draw for TB test. Patient request order to be placed. Patient isn't available today or tomorrow morning.

## 2017-06-13 ENCOUNTER — Other Ambulatory Visit: Payer: Medicare Other

## 2017-06-13 ENCOUNTER — Other Ambulatory Visit: Payer: Self-pay

## 2017-06-13 DIAGNOSIS — R05 Cough: Secondary | ICD-10-CM

## 2017-06-13 DIAGNOSIS — R059 Cough, unspecified: Secondary | ICD-10-CM

## 2017-06-15 LAB — QUANTIFERON TB GOLD ASSAY (BLOOD)
Mitogen-Nil: >10 IU/mL
QUANTIFERON NIL VALUE: 0.05 [IU]/mL
QUANTIFERON(R)-TB GOLD: NEGATIVE

## 2017-06-17 NOTE — Progress Notes (Signed)
Spoke with pt and notified of results per Dr. Wert. Pt verbalized understanding and denied any questions. 

## 2017-07-16 ENCOUNTER — Ambulatory Visit (INDEPENDENT_AMBULATORY_CARE_PROVIDER_SITE_OTHER): Payer: Medicare Other

## 2017-07-16 DIAGNOSIS — Z23 Encounter for immunization: Secondary | ICD-10-CM | POA: Diagnosis not present

## 2017-08-20 ENCOUNTER — Telehealth: Payer: Self-pay | Admitting: Internal Medicine

## 2017-08-20 NOTE — Telephone Encounter (Signed)
Per MW verbally- health exam certification can not be complete without OV. Pt has been scheduled for OV with TP on 08/22/17 @ 3:30 Nothing further needed

## 2017-08-20 NOTE — Telephone Encounter (Signed)
Form has been placed in MW's cubby for review.   Will route to MW to make aware.

## 2017-08-22 ENCOUNTER — Ambulatory Visit: Payer: Medicare Other | Admitting: Adult Health

## 2017-08-27 ENCOUNTER — Ambulatory Visit: Payer: Medicare Other | Admitting: Adult Health

## 2017-09-10 ENCOUNTER — Encounter: Payer: Self-pay | Admitting: Internal Medicine

## 2017-09-10 ENCOUNTER — Ambulatory Visit: Payer: Medicare Other | Admitting: Internal Medicine

## 2017-09-10 VITALS — BP 108/68 | HR 88 | Ht 67.5 in | Wt 179.0 lb

## 2017-09-10 DIAGNOSIS — H9209 Otalgia, unspecified ear: Secondary | ICD-10-CM | POA: Diagnosis not present

## 2017-09-10 DIAGNOSIS — Z0000 Encounter for general adult medical examination without abnormal findings: Secondary | ICD-10-CM | POA: Diagnosis not present

## 2017-09-10 NOTE — Assessment & Plan Note (Signed)
-   mammos 04/18/2015 Benign left breast calcifications compatible with calcifications associated with a small, degenerated fibroadenoma.  - mammos  09/16/15 neg  - mammos 10/19/2016 neg   Vaccinations reviewed and utd/ paperwork filled out for work

## 2017-09-10 NOTE — Assessment & Plan Note (Addendum)
ENT w/u Jenne PaneBates  06/17/13  > neg x for wax / removing it made no difference and did not reproduce her symptoms during procedure   No qualities or findings to indicate a specific cause for the discomfort ? Neuralgia from prior H Zoster s obviou rash ?     Since not waking her up and no change vs baseline no change rx > F/u ENT Jenne Pane(Bates) prn and [facility] if he feels that's [facility]

## 2017-09-10 NOTE — Progress Notes (Signed)
Subjective:     Patient ID: Sandy Bennett, female   DOB: 06/25/1951    MRN: [id number]   Brief patient profile:  [age] yobf quit smoking around 1985 with h/o mod obesity, hyperlipidemia    History of Present Illness   10/26/2014 f/u ov/Sandy Bennett re: cpx  Chief Complaint  Patient presents with  . Annual Exam    Pt is fasting. She still c/o occ cough- sometimes prod with white sputum.    Cough typically lasts up to 3 weeks after a "cold" maybe once or twice a year but not with season changes  rec Whenever you have any flare of respiratory symptoms > take pepcid ac 20 mg after bfast and supper until completely gone for a week    03/11/2017  f/u ov/Sandy Bennett re:  Obesity/ hyperlipidemia  Chief Complaint  Patient presents with  . Annual Exam    1 yr physical. Pt states she has been feeling and doing great since last visit.       Working out at gym 3 x weekly x 1 h s  Sob rec No change rx   09/10/2017  f/u ov/Sandy Bennett re: chronic recurrent L ear discomfort  S/p neg ent w/u x 2  Chief Complaint  Patient presents with  . Follow-up    Pt here today to have her health exam cert for work completed.   L ear discomfort = feels like something running inside my ear"  onset was "years ago" only noticeable at hs, never wakes her up,   typically up to 3 x week goes away weeks at a time at most and never lasts more than 15-20 min no assoc positional quality or assoc tinnitis, hearing loss, pain with chewing or sinus complaints  No obvious day to day or daytime variability or assoc excess/ purulent sputum or mucus plugs or hemoptysis or cp or chest tightness, subjective wheeze or overt sinus or hb symptoms. No unusual exposure hx or h/o childhood pna/ asthma or knowledge of premature birth.  Sleeping ok flat without nocturnal  or early am exacerbation  of respiratory  c/o's or need for noct saba. Also denies any obvious fluctuation of symptoms with weather or environmental changes or other aggravating or  alleviating factors except as outlined above   Current Allergies, Complete Past Medical History, Past Surgical History, Family History, and Social History were reviewed in Sandy Bennett.  ROS  The following are not active complaints unless bolded Hoarseness, sore throat, dysphagia, dental problems, itching, sneezing,  nasal congestion or discharge of excess mucus or purulent secretions, L ear ache,   fever, chills, sweats, unintended wt loss or wt gain, classically pleuritic or exertional cp,  orthopnea pnd or leg swelling, presyncope, palpitations, abdominal pain, anorexia, nausea, vomiting, diarrhea  or change in bowel habits or change in bladder habits, change in stools or change in urine, dysuria, hematuria,  rash, arthralgias, visual complaints, headache, numbness, weakness or ataxia or problems with walking or coordination,  change in mood/affect or memory.        No outpatient medications have been marked as taking for the 09/10/17 encounter (Office Visit) with Sandy Bennett, Sandy Sole Bennett, Sandy Bennett.              Past Medical History:  Hyperlipidemia  - Target < 160 (no additional risk factors)  Mild Obesity  - Target wt = 190 for BMI < 30, < 171 ideal  Health Maintenance....................................................................Marland Kitchen.Sandy Bennett  - pneumovax 01/25/2014 - prevnar 13 03/02/2016  -  Td 10/08/2013  - CPX 03/11/2017  - DEXA wnl  06/12/2010 - GYN  Sandy Bennett  MVP, needs dental prophylaxis  - See Echo 03/12/95  Diverticulosis  - Colonoscopy 12/19/2007   And 07/10/13  ......................................................... Sandy Bennett     Past Surgical History:  s/p cholecystectomy 1994  s/p partial hysterectomy 1970's benign  s/p carpal tunnel surgery Right hand 06/2009    Family History:  Cancer mother ? cx  brothers and sisters all healthy, only one full (sister) - she is the younger of full sister, pt older than others unsure of father's       Objective:    Physical Exam    wt 196 December 23, 2008 > 198 March 30, 2009 > 200 July 05, 2009 > 196 April 18, 2010 > 191 09/11/2011  >188 02/15/2012 > 09/12/2012  195>     194 06/01/2013 > 10/08/2013 191 > 01/25/2014 192 >190 02/12/2014 > 10/26/2014  191 > 03/02/2016   186  >  03/11/2017   171 (at target BMI)  >  09/10/2017 179   amb bf nad  Vital signs reviewed - Note on arrival 02 sats  98% on RA     HEENT: nl  turbinates bilaterally, and oropharynx. -  cough reflex - top dentures    HEENT: nl   turbinates bilaterally, and oropharynx. Nl external ear canals with min wax/ neg  cough reflex - top dentures   NECK :  without JVD/Nodes/TM/ nl carotid upstrokes bilaterally   LUNGS: no acc muscle use,  Nl contour chest which is clear to A and P bilaterally without cough on insp or exp maneuvers   CV:  RRR  no s3 or murmur or increase in P2, and no edema   ABD:  soft and nontender with nl inspiratory excursion in the supine position. No bruits or organomegaly appreciated, bowel sounds nl  MS:  Nl gait/ ext warm without deformities, calf tenderness, cyanosis or clubbing No obvious joint restrictions   SKIN: warm and dry without lesions    NEURO:  alert, approp, nl sensorium with  no motor or cerebellar deficits apparent.             Assessment:

## 2017-09-10 NOTE — Patient Instructions (Signed)
cpx is due 03/11/17

## 2017-10-07 ENCOUNTER — Telehealth: Payer: Self-pay | Admitting: Internal Medicine

## 2017-10-07 MED ORDER — AZITHROMYCIN 250 MG PO TABS: 250.0000 mg | ORAL_TABLET | Freq: Every day | ORAL | 0 refills | Status: DC

## 2017-10-07 NOTE — Telephone Encounter (Signed)
lmtcb for pt.  

## 2017-10-07 NOTE — Telephone Encounter (Signed)
Called and spoke with pt who states that she has been having a runny nose and the mucus is green in color, ear pain, and also a sore throat.  Pt denies any cough or fever.  Dr. Sherene SiresWert, please advise if an Rx can be called in for pt.  Thanks!

## 2017-10-07 NOTE — Telephone Encounter (Signed)
Ok to try zpak but ov by end of week to see me or NP

## 2017-10-07 NOTE — Telephone Encounter (Signed)
Called pt back letting her know we were sending in zpak Rx to her pharmacy and that we needed her to come in at the end of this week to follow up on symptoms after being on abx.  Pt expressed understanding. appt made with MW Friday, 10/11/17 at 2:00. Rx sent to pt's pharmacy. Nothing further needed.

## 2017-10-07 NOTE — Telephone Encounter (Signed)
Pt returning call. Cb is [phone].

## 2017-10-11 ENCOUNTER — Encounter: Payer: Self-pay | Admitting: Internal Medicine

## 2017-10-11 ENCOUNTER — Ambulatory Visit: Payer: Medicare Other | Admitting: Internal Medicine

## 2017-10-11 VITALS — BP 124/78 | HR 94 | Ht 67.5 in | Wt 173.2 lb

## 2017-10-11 DIAGNOSIS — J069 Acute upper respiratory infection, unspecified: Secondary | ICD-10-CM

## 2017-10-11 NOTE — Patient Instructions (Addendum)
No change in recommendations -  Ok to return to work 10/14/17   Centrum a to z one daily   Keep your appt for your physical  In June as scheduled

## 2017-10-11 NOTE — Progress Notes (Signed)
Subjective:     Patient ID: Sandy Bennett, female   DOB: 08-23-1951    MRN: [id number]   Brief patient profile:  23 yobf quit smoking around 1985 with h/o mod obesity, hyperlipidemia    History of Present Illness   10/26/2014 f/u ov/Ludella Pranger re: cpx  Chief Complaint  Patient presents with  . Annual Exam    Pt is fasting. She still c/o occ cough- sometimes prod with white sputum.    Cough typically lasts up to 3 weeks after a "cold" maybe once or twice a year but not with season changes  rec Whenever you have any flare of respiratory symptoms > take pepcid ac 20 mg after bfast and supper until completely gone for a week    03/11/2017  f/u ov/Karman Veney re:  Obesity/ hyperlipidemia  Chief Complaint  Patient presents with  . Annual Exam    1 yr physical. Pt states she has been feeling and doing great since last visit.       Working out at gym 3 x weekly x 1 h s  Sob rec No change rx   09/10/2017  f/u ov/Quintasia Theroux re: chronic recurrent L ear discomfort  S/p neg ent w/u x 2  Chief Complaint  Patient presents with  . Follow-up    Pt here today to have her health exam cert for work completed.   L ear discomfort = feels like something running inside my ear"  onset was "years ago" only noticeable at hs, never wakes her up,   typically up to 3 x week goes away weeks at a time at most and never lasts more than 15-20 min no assoc positional quality or assoc tinnitis, hearing loss, pain with chewing or sinus complaints rec cpx 02/2017       10/11/2017  F/u  ov/Musette Kisamore re: f/u phone care for uri rx zpak on 10/07/17  Chief Complaint  Patient presents with  . Follow-up    Pt currently on zpak and states she has one more pill left. Pt states she is feeling much better since being put on abx. Denies any cough, SOB, or CP.  acute onset 10/05/16 purulent rhinitis/ scratchy throat no fever or sob > rx zpak and all better though feels a bit run down and ?'s Whether she needs a vitamin and which one  Not  limited by breathing from desired activities   No obvious day to day or daytime variability or assoc excess/ purulent sputum or mucus plugs or hemoptysis or cp or chest tightness, subjective wheeze or overt sinus or hb symptoms. No unusual exposure hx or h/o childhood pna/ asthma or knowledge of premature birth.  Sleeping ok flat without nocturnal  or early am exacerbation  of respiratory  c/o's or need for noct saba. Also denies any obvious fluctuation of symptoms with weather or environmental changes or other aggravating or alleviating factors except as outlined above   Current Allergies, Complete Past Medical History, Past Surgical History, Family History, and Social History were reviewed in [employer] record.  ROS  The following are not active complaints unless bolded Hoarseness, sore throat, dysphagia, dental problems, itching, sneezing,  nasal congestion or discharge of excess mucus or purulent secretions, ear ache,   fever, chills, sweats, unintended wt loss or wt gain, classically pleuritic or exertional cp,  orthopnea pnd or leg swelling, presyncope, palpitations, abdominal pain, anorexia, nausea, vomiting, diarrhea  or change in bowel habits or change in bladder habits, change in stools or  change in urine, dysuria, hematuria,  rash, arthralgias, visual complaints, headache, numbness, weakness or ataxia or problems with walking or coordination,  change in mood/affect or memory.                Past Medical History:  Hyperlipidemia  - Target < 160 (no additional risk factors)  Mild Obesity  - Target wt = 190 for BMI < 30, < 171 ideal  Health Maintenance....................................................................Marland Kitchen.Leimomi Zervas  - pneumovax 01/25/2014 - prevnar 13 03/02/2016  - Td 10/08/2013  - CPX 03/11/2017  - DEXA wnl  06/12/2010 - GYN  [name]  MVP, needs dental prophylaxis  - See Echo 03/12/95  Diverticulosis  - Colonoscopy 12/19/2007   And 07/10/13   ......................................................... [name]     Past Surgical History:  s/p cholecystectomy 1994  s/p partial hysterectomy 1970's benign  s/p carpal tunnel surgery Right hand 06/2009    Family History:  Cancer mother ? cx  brothers and sisters all healthy, only one full (sister) - she is the younger of full sister, pt older than others unsure of father's       Objective:   Physical Exam    wt 196 December 23, 2008 > 198 March 30, 2009 > 200 July 05, 2009 > 196 April 18, 2010 > 191 09/11/2011  >188 02/15/2012 > 09/12/2012  195>     194 06/01/2013 > 10/08/2013 191 > 01/25/2014 192 >190 02/12/2014 > 10/26/2014  191 > 03/02/2016   186  >  03/11/2017   171 (at target BMI)  >  09/10/2017 179 >  10/11/2017  173    amb bf nad  Vital signs reviewed - Note on arrival 02 sats  98% on RA     HEENT: nl  turbinates bilaterally, and oropharynx. -  cough reflex - top dentures    HEENT: nl   turbinates bilaterally, and oropharynx. Nl external ear canals with min wax/ neg  cough reflex - top dentures   HEENT: nl  turbinates bilaterally, and oropharynx. ear canals with min retained wax without cough reflex/ top dentures    NECK :  without JVD/Nodes/TM/ nl carotid upstrokes bilaterally   LUNGS: no acc muscle use,  Nl contour chest which is clear to A and P bilaterally without cough on insp or exp maneuvers   CV:  RRR  no s3 or murmur or increase in P2, and no edema   ABD:  soft and nontender with nl inspiratory excursion in the supine position. No bruits or organomegaly appreciated, bowel sounds nl  MS:  Nl gait/ ext warm without deformities, calf tenderness, cyanosis or clubbing No obvious joint restrictions   SKIN: warm and dry without lesions    NEURO:  alert, approp, nl sensorium with  no motor or cerebellar deficits apparent.                Assessment:

## 2017-10-12 ENCOUNTER — Encounter: Payer: Self-pay | Admitting: Internal Medicine

## 2017-10-12 NOTE — Assessment & Plan Note (Signed)
rx phone care  zpak 10/07/16 > resolved at f/u ov 10/11/2017 > ok to rtw 10/14/17   Advised re approp use of prns / f/u for cpx   rec multivits one daily in meantime

## 2017-10-15 ENCOUNTER — Other Ambulatory Visit: Payer: Self-pay | Admitting: Internal Medicine

## 2017-10-15 DIAGNOSIS — Z139 Encounter for screening, unspecified: Secondary | ICD-10-CM

## 2017-10-24 ENCOUNTER — Ambulatory Visit: Payer: Medicare Other

## 2017-10-24 ENCOUNTER — Ambulatory Visit
Admission: RE | Admit: 2017-10-24 | Discharge: 2017-10-24 | Disposition: A | Discharge status: Home / Self Care | Payer: Medicare Other | Source: Ambulatory Visit | Attending: Internal Medicine | Admitting: Internal Medicine

## 2017-10-24 DIAGNOSIS — Z139 Encounter for screening, unspecified: Secondary | ICD-10-CM

## 2017-12-02 ENCOUNTER — Telehealth: Payer: Self-pay | Admitting: Internal Medicine

## 2017-12-02 NOTE — Telephone Encounter (Signed)
Called pt and advised message from the provider. Pt understood and verbalized understanding. Nothing further is needed.    

## 2017-12-02 NOTE — Telephone Encounter (Signed)
If it's just one hand it's very unlikely to be any form of rheumatism x perhaps gout so best and quickest option is hand specialist with [facility] who [facility] both hands

## 2017-12-02 NOTE — Telephone Encounter (Signed)
Spoke with pt, she wanted to see if MW can recommend a rheumatologist. She is having problems with her hand with swelling and she thinks it is arthritis. Do you have any doctors you recommend? She states she doesn't need a referral, just the name so she can call and make an appt. MW please advise.   Please leave message on VM if pt not available per pt request   Assessment & Plan Note by Nyoka CowdenWert, Michael B, MD at 10/12/2017 7:50 AM   Author: Nyoka CowdenWert, Michael B, MD Author Type: Physician Filed: 10/12/2017 7:53 AM  Note Status: Written Cosign: Cosign Not Required Encounter Date: 10/11/2017  Problem: Acute URI  Editor: Nyoka CowdenWert, Michael B, MD (Physician)    rx phone care  zpak 10/07/16 > resolved at f/u ov 10/11/2017 > ok to rtw 10/14/17   Advised re approp use of prns / f/u for cpx   rec multivits one daily in meantime    Instructions   No change in recommendations -  Ok to return to work 10/14/17   Centrum a to z one daily   Keep your appt for your physical  In June as scheduled

## 2018-01-01 ENCOUNTER — Encounter (INDEPENDENT_AMBULATORY_CARE_PROVIDER_SITE_OTHER): Payer: Self-pay | Admitting: Orthopaedic Surgery

## 2018-01-01 ENCOUNTER — Ambulatory Visit (INDEPENDENT_AMBULATORY_CARE_PROVIDER_SITE_OTHER): Payer: Medicare Other

## 2018-01-01 ENCOUNTER — Ambulatory Visit (INDEPENDENT_AMBULATORY_CARE_PROVIDER_SITE_OTHER): Payer: Medicare Other | Admitting: Orthopaedic Surgery

## 2018-01-01 DIAGNOSIS — G8929 Other chronic pain: Secondary | ICD-10-CM

## 2018-01-01 DIAGNOSIS — M25511 Pain in right shoulder: Secondary | ICD-10-CM

## 2018-01-01 MED ORDER — METHYLPREDNISOLONE ACETATE 40 MG/ML IJ SUSP
40.0000 mg | INTRAMUSCULAR | Status: AC | PRN
  Administered 2018-01-01: 40 mg via INTRA_ARTICULAR

## 2018-01-01 MED ORDER — LIDOCAINE HCL 1 % IJ SOLN
3.0000 mL | INTRAMUSCULAR | Status: AC | PRN
  Administered 2018-01-01: 3 mL

## 2018-01-01 NOTE — Progress Notes (Signed)
Office Visit Note   Patient: Sandy Bennett           Date of Birth: Jun 10, 1951           MRN: [id number] Visit Date: 01/01/2018              Requested by: Nyoka Cowden, MD [address] [place], [place] [address] PCP: Nyoka Cowden, MD   Assessment & Plan: Visit Diagnoses:  1. Chronic right shoulder pain     Plan: I feel that she would benefit most from a steroid injection in the subacromial space of the right shoulder.  I explained the risk and benefits of injections and she tolerated it well.  She will follow-up as needed.  She will avoid heavy overhead lifting and activities for the next 2 weeks.  She understands that if she continues to have problems she will let us know and come back and see [place].  Follow-Up Instructions: Return if symptoms worsen or fail to improve.   Orders:  Orders Placed This Encounter  Procedures  . XR Shoulder Right   No orders of the defined types were placed in this encounter.     Procedures: Large Joint Inj: R subacromial bursa on 01/01/2018 11:49 AM Indications: pain and diagnostic evaluation Details: 22 G 1.5 in needle  Arthrogram: No  Medications: 3 mL lidocaine 1 %; 40 mg methylPREDNISolone acetate 40 MG/ML Outcome: tolerated well, no immediate complications Procedure, treatment alternatives, risks and benefits explained, specific risks discussed. Consent was given by the patient. Immediately prior to procedure a time out was called to verify the correct patient, procedure, equipment, support staff and site/side marked as required. Patient was prepped and draped in the usual sterile fashion.       Clinical Data: No additional findings.   Subjective: Chief Complaint  Patient presents with  . Right Shoulder - Pain  Patient is a very pleasant 67 year old right-hand-dominant female who comes in with right shoulder pain and limited mobility of her shoulder.  She says she had surgery on that shoulder about 15 years ago.  She  is been having some pain and what she feels like swelling going down her arm into her hand but she denies any numbness and tingling.  She denies any neck pain.  She does take ibuprofen when she can.  She does not have any other active medical problems according to her does not take any medications on a regular basis.  She says she does have trouble with that arm reaching behind her and overhead on the right side.  HPI  Review of Systems She currently denies any headache, chest pain, shortness of breath, fever, chills, nausea, vomiting.  Objective: Vital Signs: There were no vitals taken for this visit.  Physical Exam She is alert and oriented x3 and in no acute distress Ortho Exam Examination of her right shoulder does show limitations with decreased motion with a combination of internal rotation and adduction.  Her liftoff is negative.  She has positive Neer and Hawkins signs.  She is able to abduct her shoulder and externally rotate her shoulder which is slight weakness.  She does have a weak grip strength on the right dominant side.  She has had carpal tunnel surgery in the past. Specialty Comments:  No specialty comments available.  Imaging: Xr Shoulder Right  Result Date: 01/01/2018 3 views of the right shoulder show a well located shoulder.  There is significant bone spurring off of the acromion and  the greater tuberosity.    PMFS History: Patient Active Problem List   Diagnosis Date Noted  . Ear ache 09/10/2017  . Foot pain, left 01/29/2017  . Acute URI 11/12/2016  . Chest pain of uncertain etiology 03/04/2016  . Cough 01/25/2014  . Healthcare maintenance 10/08/2013  . TMJ (temporomandibular joint syndrome) 05/27/2013  . Paresthesia of skin 02/15/2012  . Essential hypertension 07/27/2011  . Vitamin D deficiency 03/30/2009  . Other thalassemia (HCC) 03/30/2009  . Hyperlipidemia LDL goal <160 11/14/2007  . Diverticulosis of colon 11/14/2007  . MITRAL VALVE PROLAPSE, HX OF  11/14/2007   Past Medical History:  Diagnosis Date  . Diverticulosis   . Hyperlipidemia   . Mild obesity   . MVP (mitral valve prolapse)     Family History  Problem Relation Age of Onset  . Cancer Mother        ? type  . Colon polyps Neg Hx   . Esophageal cancer Neg Hx   . Stomach cancer Neg Hx     Past Surgical History:  Procedure Laterality Date  . CARPAL TUNNEL RELEASE  2010   rt  . CHOLECYSTECTOMY  1994  . PARTIAL HYSTERECTOMY  1970  . TONSILLECTOMY     Social History   Occupational History  . Occupation: Producer, television/film/videoecretary    Employer: [employer]  Tobacco Use  . Smoking status: Former Smoker    Packs/day: 0.50    Years: 10.00    Pack years: 5.00    Types: Cigarettes    Last attempt to quit: 09/25/1983    Years since quitting: 34.2  . Smokeless tobacco: Never Used  Substance and Sexual Activity  . Alcohol use: No  . Drug use: No  . Sexual activity: Not on file

## 2018-03-11 ENCOUNTER — Ambulatory Visit (INDEPENDENT_AMBULATORY_CARE_PROVIDER_SITE_OTHER): Payer: Medicare Other | Admitting: Internal Medicine

## 2018-03-11 ENCOUNTER — Other Ambulatory Visit (INDEPENDENT_AMBULATORY_CARE_PROVIDER_SITE_OTHER): Payer: Medicare Other

## 2018-03-11 ENCOUNTER — Encounter: Payer: Self-pay | Admitting: Internal Medicine

## 2018-03-11 ENCOUNTER — Other Ambulatory Visit: Payer: Self-pay | Admitting: Internal Medicine

## 2018-03-11 VITALS — BP 128/74 | HR 75 | Ht 67.0 in | Wt 184.0 lb

## 2018-03-11 DIAGNOSIS — E785 Hyperlipidemia, unspecified: Secondary | ICD-10-CM

## 2018-03-11 DIAGNOSIS — Z0000 Encounter for general adult medical examination without abnormal findings: Secondary | ICD-10-CM | POA: Diagnosis not present

## 2018-03-11 DIAGNOSIS — E039 Hypothyroidism, unspecified: Secondary | ICD-10-CM | POA: Insufficient documentation

## 2018-03-11 DIAGNOSIS — D568 Other thalassemias: Secondary | ICD-10-CM

## 2018-03-11 DIAGNOSIS — I10 Essential (primary) hypertension: Secondary | ICD-10-CM

## 2018-03-11 LAB — CBC WITH DIFFERENTIAL/PLATELET
BASOS ABS: 0 10*3/uL (ref 0.0–0.1)
Basophils Relative: 0.3 % (ref 0.0–3.0)
EOS PCT: 1.7 % (ref 0.0–5.0)
Eosinophils Absolute: 0.1 10*3/uL (ref 0.0–0.7)
HEMATOCRIT: 37.8 % (ref 36.0–46.0)
Hemoglobin: 11.9 g/dL — ABNORMAL LOW (ref 12.0–15.0)
LYMPHS PCT: 37 % (ref 12.0–46.0)
Lymphs Abs: 1.5 10*3/uL (ref 0.7–4.0)
MCHC: 31.5 g/dL (ref 30.0–36.0)
MCV: 72.5 fl — AB (ref 78.0–100.0)
MONOS PCT: 13.6 % — AB (ref 3.0–12.0)
Monocytes Absolute: 0.6 10*3/uL (ref 0.1–1.0)
NEUTROS ABS: 1.9 10*3/uL (ref 1.4–7.7)
Neutrophils Relative %: 47.4 % (ref 43.0–77.0)
PLATELETS: 202 10*3/uL (ref 150.0–400.0)
RBC: 5.21 Mil/uL — AB (ref 3.87–5.11)
RDW: 14.8 % (ref 11.5–15.5)
WBC: 4.1 10*3/uL (ref 4.0–10.5)

## 2018-03-11 LAB — TSH: TSH: 5.09 u[IU]/mL — ABNORMAL HIGH (ref 0.35–4.50)

## 2018-03-11 LAB — HEPATIC FUNCTION PANEL
ALT: 23 U/L (ref 0–35)
AST: 19 U/L (ref 0–37)
Albumin: 4.1 g/dL (ref 3.5–5.2)
Alkaline Phosphatase: 72 U/L (ref 39–117)
Bilirubin, Direct: 0 mg/dL (ref 0.0–0.3)
TOTAL PROTEIN: 7.9 g/dL (ref 6.0–8.3)
Total Bilirubin: 0.2 mg/dL (ref 0.2–1.2)

## 2018-03-11 LAB — BASIC METABOLIC PANEL
BUN: 9 mg/dL (ref 6–23)
CALCIUM: 9.8 mg/dL (ref 8.4–10.5)
CHLORIDE: 105 meq/L (ref 96–112)
CO2: 29 meq/L (ref 19–32)
CREATININE: 0.74 mg/dL (ref 0.40–1.20)
GFR: 100.28 mL/min (ref >60.00)
Glucose, Bld: 110 mg/dL — ABNORMAL HIGH (ref 70–99)
Potassium: 4.7 mEq/L (ref 3.5–5.1)
Sodium: 142 mEq/L (ref 135–145)

## 2018-03-11 LAB — LIPID PANEL
CHOLESTEROL: 170 mg/dL (ref 0–200)
HDL: 63.8 mg/dL (ref >39.00)
LDL Cholesterol: 90 mg/dL (ref 0–99)
NonHDL: 106.68
Total CHOL/HDL Ratio: 3
Triglycerides: 82 mg/dL (ref 0.0–149.0)
VLDL: 16.4 mg/dL (ref 0.0–40.0)

## 2018-03-11 NOTE — Assessment & Plan Note (Signed)
-   mammos 04/18/2015 Benign left breast calcifications compatible with calcifications associated with a small, degenerated fibroadenoma.  - mammos 10/19/2016 neg and 10/24/2017 neg   Up to date as of 03/11/2018 > referred to Primary care

## 2018-03-11 NOTE — Progress Notes (Signed)
Spoke with pt and notified of results per Dr. Sherene SiresWert. Pt verbalized understanding and denied any questions. Lab order was placed

## 2018-03-11 NOTE — Progress Notes (Signed)
Subjective:     Patient ID: Sandy Bennett, female   DOB: [birth year]    MRN: [id number]   Brief patient profile:  [age] yobf quit smoking around 1985 with h/o mod obesity, hyperlipidemia    History of Present Illness   10/26/2014 f/u ov/Sandy Bennett re: cpx  Chief Complaint  Patient presents with  . Annual Exam    Pt is fasting. She still c/o occ cough- sometimes prod with white sputum.    Cough typically lasts up to 3 weeks after a "cold" maybe once or twice a year but not with season changes  rec Whenever you have any flare of respiratory symptoms > take pepcid ac 20 mg after bfast and supper until completely gone for a week    03/11/2017  f/u ov/Sandy Bennett re:  Obesity/ hyperlipidemia  Chief Complaint  Patient presents with  . Annual Exam    1 yr physical. Pt states she has been feeling and doing great since last visit.   Working out at gym 3 x weekly x 1 h s  Sob rec No change rx     03/11/2018  f/u ov/Sandy Bennett re:   Hyperlipidemia / borderline hypothryoid Chief Complaint  Patient presents with  . Annual Exam    Fasting. No co's today.   Dyspnea:  Working out at gym 3 x weekly x 1.5 h s sob/ cp/claudication Cough: none Sleep: ok    No  excess/ purulent sputum or mucus plugs or hemoptysis or cp or chest tightness, subjective wheeze or overt sinus or hb symptoms. No unusual exposure hx or h/o childhood pna/ asthma or knowledge of premature birth.  Sleeping ok flat without nocturnal  or early am exacerbation  of respiratory  c/o's or need for noct saba. Also denies any obvious fluctuation of symptoms with weather or environmental changes or other aggravating or alleviating factors except as outlined above   Current Allergies, Complete Past Medical History, Past Surgical History, Family History, and Social History were reviewed in [name] electronic medical record.  ROS  The following are not active complaints unless bolded Hoarseness, sore throat, dysphagia, dental problems,  itching, sneezing,  nasal congestion or discharge of excess mucus or purulent secretions, ear ache,   fever, chills, sweats, unintended wt loss or wt gain, classically pleuritic or exertional cp,  orthopnea pnd or leg swelling, presyncope, palpitations, abdominal pain, anorexia, nausea, vomiting, diarrhea  or change in bowel habits or change in bladder habits, change in stools or change in urine, dysuria, hematuria,  rash, arthralgias, visual complaints, headache, numbness, weakness or ataxia or problems with walking or coordination,  change in mood/affect or memory.        No outpatient medications have been marked as taking for the 03/11/18 encounter (Office Visit) with Nyoka CowdenWert, Jamontae Thwaites B, MD.- confirmed on no meds             Past Medical History:  Hyperlipidemia  - Target < 160 (no additional risk factors)  Mild Obesity  - Target wt = 190 for BMI < 30, < 171 ideal  Health Maintenance....................................................................Marland Kitchen.[name]  - pneumovax 01/25/2014 - prevnar 13 03/02/2016  - Td 10/08/2013  - CPX 03/11/2018   - DEXA wnl  06/12/2010 - GYN  [name]  S/p partial hysterectomy  MVP, needs dental prophylaxis  - See Echo 03/12/95  Diverticulosis  - Colonoscopy 12/19/2007   And 07/10/13  ......................................................... [name]     Past Surgical History:  s/p cholecystectomy 1994  s/p partial hysterectomy 1970's benign  s/p  carpal tunnel surgery Right hand 06/2009    Family History:  Cancer mother ? cx  brothers and sisters all healthy, only one full (sister) - she is the younger of full sister, pt older than others unsure of father's       Objective:   Physical Exam    wt 196 December 23, 2008 > 198 March 30, 2009 > 200 July 05, 2009 > 196 April 18, 2010 > 191 09/11/2011  >188 02/15/2012 > 09/12/2012  195>     194 06/01/2013 > 10/08/2013 191 > 01/25/2014 192 >190 02/12/2014 > 10/26/2014  191 > 03/02/2016   186  >  03/11/2017   171 (at target BMI)  >   09/10/2017 179 >  10/11/2017  173 >  03/11/2018 184     amb bf nad    Vital signs reviewed - Note on arrival 02 sats  100% on RA       top dentures  03/11/2018  HEENT: nl  turbinates bilaterally, and oropharynx.wax both ears R > L  without cough reflex. Top dentures    NECK :  without JVD/Nodes/TM/ nl carotid upstrokes bilaterally   LUNGS: no acc muscle use,  Nl contour chest which is clear to A and P bilaterally without cough on insp or exp maneuvers   CV:  RRR  no s3 or murmur or increase in P2, and no edema - pedal pulses sym both feet   ABD:  soft and nontender with nl inspiratory excursion in the supine position. No bruits or organomegaly appreciated, bowel sounds nl  MS:  Nl gait/ ext warm without deformities, calf tenderness, cyanosis or clubbing No obvious joint restrictions   SKIN: warm and dry without lesions    NEURO:  alert, approp, nl sensorium with  no motor or cerebellar deficits apparent.        ekg 03/11/2018  SR, decreased RWP ? Lead placement as nl R in V3        Labs ordered/ reviewed:      Chemistry      Component Value Date/Time   NA 142 03/11/2018 0909   K 4.7 03/11/2018 0909   CL 105 03/11/2018 0909   CO2 29 03/11/2018 0909   BUN 9 03/11/2018 0909   CREATININE 0.74 03/11/2018 0909      Component Value Date/Time   CALCIUM 9.8 03/11/2018 0909   ALKPHOS 72 03/11/2018 0909   AST 19 03/11/2018 0909   ALT 23 03/11/2018 0909   BILITOT 0.2 03/11/2018 0909        Lab Results  Component Value Date   WBC 4.1 03/11/2018   HGB 11.9 (L) 03/11/2018   HCT 37.8 03/11/2018   MCV 72.5 (L) 03/11/2018   PLT 202.0 03/11/2018         Lab Results  Component Value Date   TSH 5.09 (H) 03/11/2018         Lab Results  Component Value Date   ESRSEDRATE 25 (H) 10/08/2013   ESRSEDRATE 48 (H) 12/23/2008                  Assessment:

## 2018-03-11 NOTE — Assessment & Plan Note (Signed)
Adequate control on present rx, reviewed in detail with pt > no change in rx needed   = diet/ ex 

## 2018-03-11 NOTE — Assessment & Plan Note (Signed)
No change microcytic but normochromic typical of Thal minor > no change rx

## 2018-03-11 NOTE — Assessment & Plan Note (Signed)
-   Target  < 160 borerline hbp with no additional risk factors     Lab Results  Component Value Date   CHOL 170 03/11/2018   HDL 63.80 03/11/2018   LDLCALC 90 03/11/2018   TRIG 82.0 03/11/2018   CHOLHDL 3 03/11/2018     Adequate control on present rx, reviewed in detail with pt > no change in rx needed  = diet/ ex

## 2018-03-11 NOTE — Assessment & Plan Note (Signed)
Dx 03/11/2018 > rec recheck tsh in 3 months / f/u pcp

## 2018-03-11 NOTE — Patient Instructions (Signed)
Please remember to go to the lab department downstairs in the basement  for your tests - we will call you with the results when they are available.      Dr Ardell IsaacsStark's office will call you for your colonoscopy   Let me know if you would like to be referred for Primary care here  Prior to your next annual evaluation  Pulmonary follow up is as needed.

## 2018-05-02 ENCOUNTER — Telehealth: Payer: Self-pay | Admitting: Internal Medicine

## 2018-05-02 DIAGNOSIS — I10 Essential (primary) hypertension: Secondary | ICD-10-CM

## 2018-05-02 NOTE — Telephone Encounter (Signed)
Spoke with the pt  She states wants referral to Dr Renford Dillsonald Polite for new PCP  Referral placed and nothing further needed per pt

## 2018-06-26 ENCOUNTER — Emergency Department (HOSPITAL_COMMUNITY): Payer: Medicare Other

## 2018-06-26 ENCOUNTER — Encounter (HOSPITAL_COMMUNITY): Payer: Self-pay | Admitting: *Deleted

## 2018-06-26 ENCOUNTER — Emergency Department (HOSPITAL_COMMUNITY)
Admission: EM | Admit: 2018-06-26 | Discharge: 2018-06-26 | Disposition: A | Discharge status: Home / Self Care | Payer: Medicare Other | Attending: Emergency Medicine | Admitting: Emergency Medicine

## 2018-06-26 DIAGNOSIS — I10 Essential (primary) hypertension: Secondary | ICD-10-CM | POA: Diagnosis not present

## 2018-06-26 DIAGNOSIS — R519 Headache, unspecified: Secondary | ICD-10-CM

## 2018-06-26 DIAGNOSIS — R51 Headache: Secondary | ICD-10-CM | POA: Insufficient documentation

## 2018-06-26 DIAGNOSIS — E039 Hypothyroidism, unspecified: Secondary | ICD-10-CM | POA: Insufficient documentation

## 2018-06-26 DIAGNOSIS — Z87891 Personal history of nicotine dependence: Secondary | ICD-10-CM | POA: Insufficient documentation

## 2018-06-26 LAB — BASIC METABOLIC PANEL
ANION GAP: 10 (ref 5–15)
BUN: 11 mg/dL (ref 8–23)
CALCIUM: 9.6 mg/dL (ref 8.9–10.3)
CO2: 27 mmol/L (ref 22–32)
Chloride: 107 mmol/L (ref 98–111)
Creatinine, Ser: 0.85 mg/dL (ref 0.44–1.00)
GLUCOSE: 150 mg/dL — AB (ref 70–99)
POTASSIUM: 3.7 mmol/L (ref 3.5–5.1)
Sodium: 144 mmol/L (ref 135–145)

## 2018-06-26 LAB — CBC WITH DIFFERENTIAL/PLATELET
BASOS ABS: 0 10*3/uL (ref 0.0–0.1)
Basophils Relative: 0 %
EOS ABS: 0 10*3/uL (ref 0.0–0.7)
EOS PCT: 1 %
HCT: 39.2 % (ref 36.0–46.0)
Hemoglobin: 12.1 g/dL (ref 12.0–15.0)
LYMPHS PCT: 43 %
Lymphs Abs: 1.8 10*3/uL (ref 0.7–4.0)
MCH: 22.7 pg — ABNORMAL LOW (ref 26.0–34.0)
MCHC: 30.9 g/dL (ref 30.0–36.0)
MCV: 73.7 fL — AB (ref 78.0–100.0)
MONO ABS: 0.6 10*3/uL (ref 0.1–1.0)
Monocytes Relative: 15 %
Neutro Abs: 1.7 10*3/uL (ref 1.7–7.7)
Neutrophils Relative %: 41 %
Platelets: 216 10*3/uL (ref 150–400)
RBC: 5.32 MIL/uL — AB (ref 3.87–5.11)
RDW: 14.8 % (ref 11.5–15.5)
WBC: 4.2 10*3/uL (ref 4.0–10.5)

## 2018-06-26 NOTE — Discharge Instructions (Addendum)
You were evaluated in the emergency department for a few days of headache and elevated blood pressure.  You had blood work EKG and CAT scan that did not show an obvious cause of your symptoms.  You should continue Tylenol or ibuprofen for your headache and keep a record of your blood pressures to bring to your primary care doctor appointment.  Please return if any worsening symptoms.

## 2018-06-26 NOTE — ED Triage Notes (Signed)
Pt complains of headache for the past week. Pt went to dentist and was hypertensive this morning. Pt states she does not have hx of hypertension.

## 2018-06-26 NOTE — ED Provider Notes (Signed)
[facility]-EMERGENCY DEPT Provider Note   CSN: [id number] Arrival date & time: 06/26/18  1211     History   Chief Complaint Chief Complaint  Patient presents with  . Hypertension  . Headache    HPI Sandy Bennett is a 67 y.o. female.  She has no significant past medical history.  She said she has had a frontal headache mild to moderate intensity since Sunday.  She says it improves with ibuprofen but then recurs again the next morning.  Is not associated with any blurry vision numbness or weakness.  She does not usually get headaches.  She talked to a nurse friend who recommended she check her blood pressure and she found it to be elevated.  Her usual blood pressures are usually 120/80.  She also went to her dentist today and they found her diastolics to be 100.  She cannot think of any precipitant for her blood pressure being elevated.  She is on no medicines using herbal has not gained weight has not changed her diet.  No chest pain no shortness of breath no abdominal pain no vomiting no diarrhea no urinary symptoms  The history is provided by the patient.  Hypertension  This is a new problem. The current episode started 2 days ago. The problem occurs constantly. The problem has not changed since onset.Associated symptoms include headaches. Pertinent negatives include no chest pain, no abdominal pain and no shortness of breath. Nothing aggravates the symptoms. Nothing relieves the symptoms. She has tried nothing for the symptoms. The treatment provided no relief.  Headache   This is a new problem. Episode onset: 5 days. The problem has not changed since onset.The headache is associated with nothing. The pain is located in the frontal region. The pain is moderate. The pain does not radiate. Pertinent negatives include no fever, no near-syncope, no palpitations, no syncope, no shortness of breath, no nausea and no vomiting. She has tried NSAIDs for the symptoms. The  treatment provided moderate relief.    Past Medical History:  Diagnosis Date  . Diverticulosis   . Hyperlipidemia   . Mild obesity   . MVP (mitral valve prolapse)     Patient Active Problem List   Diagnosis Date Noted  . Hypothyroidism, acquired 03/11/2018  . Ear ache 09/10/2017  . Foot pain, left 01/29/2017  . Acute URI 11/12/2016  . Chest pain of uncertain etiology 03/04/2016  . Cough 01/25/2014  . Healthcare maintenance 10/08/2013  . TMJ (temporomandibular joint syndrome) 05/27/2013  . Paresthesia of skin 02/15/2012  . Essential hypertension 07/27/2011  . Vitamin D deficiency 03/30/2009  . Other thalassemia (HCC) 03/30/2009  . Hyperlipidemia LDL goal <160 11/14/2007  . Diverticulosis of colon 11/14/2007  . MITRAL VALVE PROLAPSE, HX OF 11/14/2007    Past Surgical History:  Procedure Laterality Date  . CARPAL TUNNEL RELEASE  2010   rt  . CHOLECYSTECTOMY  1994  . PARTIAL HYSTERECTOMY  1970  . TONSILLECTOMY       OB History   None      Home Medications    Prior to Admission medications   Not on File    Family History Family History  Problem Relation Age of Onset  . Cancer Mother        ? type  . Colon polyps Neg Hx   . Esophageal cancer Neg Hx   . Stomach cancer Neg Hx     Social History Social History   Tobacco Use  . Smoking  status: Former Smoker    Packs/day: 0.50    Years: 10.00    Pack years: 5.00    Types: Cigarettes    Last attempt to quit: 09/25/1983    Years since quitting: 34.7  . Smokeless tobacco: Never Used  Substance Use Topics  . Alcohol use: No  . Drug use: No     Allergies   Patient has no known allergies.   Review of Systems Review of Systems  Constitutional: Negative for fever.  HENT: Negative for sore throat.   Eyes: Negative for visual disturbance.  Respiratory: Negative for shortness of breath.   Cardiovascular: Negative for chest pain, palpitations, syncope and near-syncope.  Gastrointestinal: Negative for  abdominal pain, nausea and vomiting.  Genitourinary: Negative for dysuria.  Musculoskeletal: Negative for neck pain.  Skin: Negative for rash.  Neurological: Positive for headaches.     Physical Exam Updated Vital Signs BP (!) 145/89 (BP Location: Left Arm)   Pulse 93   Temp 97.8 F (36.6 C) (Oral)   Resp 18   SpO2 100%   Physical Exam  Constitutional: She is oriented to person, place, and time. She appears well-developed and well-nourished. No distress.  HENT:  Head: Normocephalic and atraumatic.  Eyes: Pupils are equal, round, and reactive to light. Conjunctivae and EOM are normal.  Neck: Neck supple.  Cardiovascular: Normal rate, regular rhythm and normal heart sounds.  No murmur heard. Pulmonary/Chest: Effort normal and breath sounds normal. No respiratory distress.  Abdominal: Soft. There is no tenderness.  Musculoskeletal: She exhibits no edema.  Neurological: She is alert and oriented to person, place, and time. She has normal strength. GCS eye subscore is 4. GCS verbal subscore is 5. GCS motor subscore is 6.  Skin: Skin is warm and dry.  Psychiatric: She has a normal mood and affect.  Nursing note and vitals reviewed.    ED Treatments / Results  Labs (all labs ordered are listed, but only abnormal results are displayed) Labs Reviewed  BASIC METABOLIC PANEL - Abnormal; Notable for the following components:      Result Value   Glucose, Bld 150 (*)    All other components within normal limits  CBC WITH DIFFERENTIAL/PLATELET - Abnormal; Notable for the following components:   RBC 5.32 (*)    MCV 73.7 (*)    MCH 22.7 (*)    All other components within normal limits    EKG EKG Interpretation  Date/Time:  Thursday June 26 2018 16:58:01 EDT Ventricular Rate:  89 PR Interval:    QRS Duration: 91 QT Interval:  365 QTC Calculation: 445 R Axis:   66 Text Interpretation:  Sinus rhythm Consider left ventricular hypertrophy Baseline wander in lead(s) V1 similar  to prior 3/00 Confirmed by Meridee Score [id number]) on 06/26/2018 5:25:44 PM Also confirmed by Meridee Score [phone]), editor Elita Quick ([id number])  on 06/27/2018 9:10:38 AM   Radiology Ct Head Wo Contrast  Result Date: 06/26/2018 CLINICAL DATA:  Headache for the past week. Hypertension today. EXAM: CT HEAD WITHOUT CONTRAST TECHNIQUE: Contiguous axial images were obtained from the base of the skull through the vertex without intravenous contrast. COMPARISON:  Report dated 11/29/1998. FINDINGS: Brain: Mild to moderate enlargement of the ventricles and subarachnoid spaces. Mild patchy white matter low density in both cerebral hemispheres. No intracranial hemorrhage, mass lesion or CT evidence of acute infarction. Vascular: No hyperdense vessel or unexpected calcification. Skull: Normal. Negative for fracture or focal lesion. Sinuses/Orbits: Unremarkable. Other: None. IMPRESSION: 1. No acute abnormality. 2.  Mild to moderate diffuse cerebral and cerebellar atrophy. 3. Mild chronic small vessel white matter ischemic changes in both cerebral hemispheres. Electronically Signed   By: Beckie Salts M.D.   On: 06/26/2018 17:13    Procedures Procedures (including critical care time)  Medications Ordered in ED Medications - No data to display   Initial Impression / Assessment and Plan / ED Course  I have reviewed the triage vital signs and the nursing notes.  Pertinent labs & imaging results that were available during my care of the patient were reviewed by me and considered in my medical decision making (see chart for details).  Clinical Course as of Jun 27 1558  Thu Jun 26, 2018  1655 Patient with new headache for approximately 5 days and also found to have elevated blood pressures.  She cannot think of any precipitating event for these.  She has a completely benign exam.  She is getting some screening labs head CT EKG.   [MB]  1853 Patient's blood pressure is better since her arrival.  I reviewed  her labs with her and her daughter.  She will follow-up with her PCP on Monday and I asked if she would start recording her blood pressures and keep a record to bring into her doctor.  As far as the headache goes I would recommend just Tylenol and ibuprofen for now.  She understands to return if any worsening symptoms.   [MB]    Clinical Course User Index [MB] Terrilee Files, MD     Final Clinical Impressions(s) / ED Diagnoses   Final diagnoses:  Hypertension, unspecified type  Generalized headache    ED Discharge Orders    None       Terrilee Files, MD 06/27/18 [phone]

## 2018-07-21 ENCOUNTER — Encounter: Payer: Self-pay | Admitting: Gastroenterology

## 2018-10-03 ENCOUNTER — Other Ambulatory Visit: Payer: Self-pay | Admitting: Internal Medicine

## 2018-10-03 DIAGNOSIS — Z1231 Encounter for screening mammogram for malignant neoplasm of breast: Secondary | ICD-10-CM

## 2018-10-30 ENCOUNTER — Ambulatory Visit
Admission: RE | Admit: 2018-10-30 | Discharge: 2018-10-30 | Disposition: A | Discharge status: Home / Self Care | Payer: Medicare Other | Source: Ambulatory Visit | Attending: Internal Medicine | Admitting: Internal Medicine

## 2018-10-30 DIAGNOSIS — Z1231 Encounter for screening mammogram for malignant neoplasm of breast: Secondary | ICD-10-CM

## 2019-04-22 ENCOUNTER — Other Ambulatory Visit: Payer: Self-pay

## 2019-04-22 DIAGNOSIS — Z20822 Contact with and (suspected) exposure to covid-19: Secondary | ICD-10-CM

## 2019-04-23 LAB — NOVEL CORONAVIRUS, NAA: SARS-CoV-2, NAA: NOT DETECTED

## 2019-04-29 ENCOUNTER — Telehealth: Payer: Self-pay | Admitting: General Practice

## 2019-04-29 NOTE — Telephone Encounter (Signed)
Informed patient of COVID results °

## 2019-10-06 ENCOUNTER — Other Ambulatory Visit: Payer: Self-pay | Admitting: Internal Medicine

## 2019-10-06 DIAGNOSIS — Z1231 Encounter for screening mammogram for malignant neoplasm of breast: Secondary | ICD-10-CM

## 2019-11-12 ENCOUNTER — Ambulatory Visit: Payer: Medicare Other

## 2019-12-11 ENCOUNTER — Other Ambulatory Visit: Payer: Self-pay

## 2019-12-11 ENCOUNTER — Ambulatory Visit
Admission: RE | Admit: 2019-12-11 | Discharge: 2019-12-11 | Disposition: A | Discharge status: Home / Self Care | Payer: Medicare PPO | Source: Ambulatory Visit | Attending: Internal Medicine | Admitting: Internal Medicine

## 2019-12-11 DIAGNOSIS — Z1231 Encounter for screening mammogram for malignant neoplasm of breast: Secondary | ICD-10-CM

## 2020-06-22 ENCOUNTER — Ambulatory Visit: Payer: Self-pay

## 2020-06-22 ENCOUNTER — Encounter: Payer: Self-pay | Admitting: Orthopaedic Surgery

## 2020-06-22 ENCOUNTER — Ambulatory Visit: Payer: Medicare PPO | Admitting: Orthopaedic Surgery

## 2020-06-22 DIAGNOSIS — M549 Dorsalgia, unspecified: Secondary | ICD-10-CM

## 2020-06-22 DIAGNOSIS — M5136 Other intervertebral disc degeneration, lumbar region: Secondary | ICD-10-CM | POA: Diagnosis not present

## 2020-06-22 DIAGNOSIS — M545 Low back pain, unspecified: Secondary | ICD-10-CM

## 2020-06-22 NOTE — Progress Notes (Signed)
Office Visit Note   Patient: Sandy Bennett           Date of Birth: 03-10-1951           MRN: [id number] Visit Date: 06/22/2020              Requested by: Renford Dills, MD [address] [place],  [place] [address] PCP: Renford Dills, MD   Assessment & Plan: Visit Diagnoses:  1. Mid back pain   2. Acute low back pain, unspecified back pain laterality, unspecified whether sciatica present   3. Degenerative disc disease, lumbar     Plan: We will send her to formal physical therapy for hamstring stretching back exercises mainly to stay home exercise program.  Discussed with her if her back pain persist or becomes worse, any waking pain or she develops any sciatica-like symptoms she should return.  Questions encouraged and answered at length.  Follow-Up Instructions: Return if symptoms worsen or fail to improve.   Orders:  Orders Placed This Encounter  Procedures  . XR Lumbar Spine 2-3 Views  . XR Thoracic Spine 2 View   No orders of the defined types were placed in this encounter.     Procedures: No procedures performed   Clinical Data: No additional findings.   Subjective: Chief Complaint  Patient presents with  . Middle Back - Pain  . Lower Back - Pain    HPI Sandy Bennett is a 69 year old female known to Dr. Magnus Ivan service comes in today with new complaint of low back pain.  States she has had chronic low back pain for years but over the last 3 weeks is getting worse.  Pains been on and off she is actually having a good day today and states that he thought about canceling the appointment.  She denies any fevers chills shortness of breath chest pain.  Denies any numbness tingling down either leg, saddle anesthesia like symptoms, waking pain or bowel bladder dysfunction.  Most of her pain is in the lower lumbar region does radiate to the lower thoracic region at times.  States she has pain when bending over.  She has had no known injury.  No  surgeries for her back.  Ranks her pain to be 6 out of 10 pain at worst.  She is taken ibuprofen for the pain.  Review of Systems See HPI otherwise negative or noncontributory.  Objective: Vital Signs: There were no vitals taken for this visit.  Physical Exam Constitutional:      Appearance: She is not ill-appearing or diaphoretic.  Cardiovascular:     Pulses: Normal pulses.  Pulmonary:     Effort: Pulmonary effort is normal.  Neurological:     Mental Status: She is alert and oriented to person, place, and time.  Psychiatric:        Mood and Affect: Mood normal.     Ortho Exam Lower extremities 5 out of 5 strength throughout against resistance.  Sensation grossly intact bilateral feet except for the left great toe with subjective decreased sensation compared to the right.  Deep tendon reflexes are 2+ at the knees and ankles and equal and symmetric.  Straight leg raise is negative bilaterally.  She has good range of motion bilateral hips without pain.  Tight hamstrings bilaterally.  Forward flexion she comes within 4 to 6 inches of touching her toes.  She has discomfort in lower lumbar spine with extension.  Tenderness left lower lumbar paraspinous region with palpation otherwise  no tenderness along thoracic spine or lumbar spine. Specialty Comments:  No specialty comments available.  Imaging: XR Thoracic Spine 2 View  Result Date: 06/22/2020 Thoracic spine 2 views: No acute fractures.  Disc spaces overall well-maintained.  No spondylolisthesis.  XR Lumbar Spine 2-3 Views  Result Date: 06/22/2020 Lumbar spine 2 views: No acute fracture.  Grade 1 spondylolisthesis L4 on 5.  Facet degenerative changes lower lumbar spine.  Disc space overall well-maintained.  No acute fractures or bony abnormalities otherwise.  Surgical clips are seen in the right abdominal region from a previous cholecystectomy.    PMFS History: Patient Active Problem List   Diagnosis Date Noted  .  Hypothyroidism, acquired 03/11/2018  . Ear ache 09/10/2017  . Foot pain, left 01/29/2017  . Acute URI 11/12/2016  . Chest pain of uncertain etiology 03/04/2016  . Cough 01/25/2014  . Healthcare maintenance 10/08/2013  . TMJ (temporomandibular joint syndrome) 05/27/2013  . Paresthesia of skin 02/15/2012  . Essential hypertension 07/27/2011  . Vitamin D deficiency 03/30/2009  . Other thalassemia (HCC) 03/30/2009  . Hyperlipidemia LDL goal <160 11/14/2007  . Diverticulosis of colon 11/14/2007  . MITRAL VALVE PROLAPSE, HX OF 11/14/2007   Past Medical History:  Diagnosis Date  . Diverticulosis   . Hyperlipidemia   . Mild obesity   . MVP (mitral valve prolapse)     Family History  Problem Relation Age of Onset  . Cancer Mother        ? type  . Breast cancer Sister 32  . Colon polyps Neg Hx   . Esophageal cancer Neg Hx   . Stomach cancer Neg Hx     Past Surgical History:  Procedure Laterality Date  . CARPAL TUNNEL RELEASE  2010   rt  . CHOLECYSTECTOMY  1994  . PARTIAL HYSTERECTOMY  1970  . TONSILLECTOMY     Social History   Occupational History  . Occupation: Producer, television/film/video: [employer]  Tobacco Use  . Smoking status: Former Smoker    Packs/day: 0.50    Years: 10.00    Pack years: 5.00    Types: Cigarettes    Quit date: 09/25/1983    Years since quitting: 36.7  . Smokeless tobacco: Never Used  Substance and Sexual Activity  . Alcohol use: No  . Drug use: No  . Sexual activity: Not on file

## 2020-07-05 ENCOUNTER — Other Ambulatory Visit: Payer: Self-pay | Admitting: Orthopaedic Surgery

## 2020-07-05 ENCOUNTER — Telehealth: Payer: Self-pay | Admitting: Orthopaedic Surgery

## 2020-07-05 ENCOUNTER — Other Ambulatory Visit: Payer: Self-pay

## 2020-07-05 DIAGNOSIS — M545 Low back pain, unspecified: Secondary | ICD-10-CM

## 2020-07-05 DIAGNOSIS — M549 Dorsalgia, unspecified: Secondary | ICD-10-CM

## 2020-07-05 DIAGNOSIS — M5136 Other intervertebral disc degeneration, lumbar region: Secondary | ICD-10-CM

## 2020-07-05 MED ORDER — TRAMADOL HCL 50 MG PO TABS: 50.0000 mg | ORAL_TABLET | Freq: Four times a day (QID) | ORAL | 0 refills | Status: DC | PRN

## 2020-07-05 NOTE — Telephone Encounter (Signed)
I sent in some tramadol to the [facility] on [address] for her to try.

## 2020-07-05 NOTE — Telephone Encounter (Signed)
Pt called stating she was supposed to get set up for PT but never heard anything back and now she has back pain that's spreading to her hips; pt would like a CB to make sure everything is ok with PT referral and get advised on what she can do for her pain.  [phone]

## 2020-07-05 NOTE — Telephone Encounter (Signed)
Patient aware this was called in for her  

## 2020-07-05 NOTE — Telephone Encounter (Signed)
I am not sure what happened here, but a referral wasn't put in at her visit. Can we get her in asap please? Thank you and sorry

## 2020-07-05 NOTE — Telephone Encounter (Signed)
Patient says she has an appointment with [facility] next week. She was calling to let Dr. Magnus Ivan know that she is in a lot of pain and it is hard for her to walk. Her call back number is [phone]

## 2020-07-06 DIAGNOSIS — Z23 Encounter for immunization: Secondary | ICD-10-CM | POA: Diagnosis not present

## 2020-07-07 ENCOUNTER — Ambulatory Visit: Payer: Medicare PPO | Admitting: Physical Therapy

## 2020-07-13 DIAGNOSIS — M47896 Other spondylosis, lumbar region: Secondary | ICD-10-CM | POA: Diagnosis not present

## 2020-07-26 DIAGNOSIS — I10 Essential (primary) hypertension: Secondary | ICD-10-CM | POA: Diagnosis not present

## 2020-07-29 DIAGNOSIS — M47896 Other spondylosis, lumbar region: Secondary | ICD-10-CM | POA: Diagnosis not present

## 2020-11-14 ENCOUNTER — Other Ambulatory Visit: Payer: Self-pay | Admitting: Internal Medicine

## 2020-11-14 DIAGNOSIS — Z1231 Encounter for screening mammogram for malignant neoplasm of breast: Secondary | ICD-10-CM

## 2020-12-20 DIAGNOSIS — Z01419 Encounter for gynecological examination (general) (routine) without abnormal findings: Secondary | ICD-10-CM | POA: Diagnosis not present

## 2021-01-04 ENCOUNTER — Inpatient Hospital Stay: Admission: RE | Admit: 2021-01-04 | Payer: Medicare PPO | Source: Ambulatory Visit

## 2021-01-06 ENCOUNTER — Other Ambulatory Visit: Payer: Self-pay

## 2021-01-06 ENCOUNTER — Ambulatory Visit
Admission: RE | Admit: 2021-01-06 | Discharge: 2021-01-06 | Disposition: A | Discharge status: Home / Self Care | Payer: Medicare PPO | Source: Ambulatory Visit | Attending: Internal Medicine | Admitting: Internal Medicine

## 2021-01-06 DIAGNOSIS — Z1231 Encounter for screening mammogram for malignant neoplasm of breast: Secondary | ICD-10-CM

## 2021-02-13 DIAGNOSIS — I10 Essential (primary) hypertension: Secondary | ICD-10-CM | POA: Diagnosis not present

## 2021-02-13 DIAGNOSIS — Z23 Encounter for immunization: Secondary | ICD-10-CM | POA: Diagnosis not present

## 2021-02-13 DIAGNOSIS — Z0000 Encounter for general adult medical examination without abnormal findings: Secondary | ICD-10-CM | POA: Diagnosis not present

## 2021-02-13 DIAGNOSIS — Z1389 Encounter for screening for other disorder: Secondary | ICD-10-CM | POA: Diagnosis not present

## 2021-02-13 DIAGNOSIS — E2839 Other primary ovarian failure: Secondary | ICD-10-CM | POA: Diagnosis not present

## 2021-02-13 DIAGNOSIS — R002 Palpitations: Secondary | ICD-10-CM | POA: Diagnosis not present

## 2021-02-21 ENCOUNTER — Other Ambulatory Visit: Payer: Self-pay | Admitting: Internal Medicine

## 2021-02-21 DIAGNOSIS — E2839 Other primary ovarian failure: Secondary | ICD-10-CM

## 2021-03-14 DIAGNOSIS — S0500XA Injury of conjunctiva and corneal abrasion without foreign body, unspecified eye, initial encounter: Secondary | ICD-10-CM | POA: Diagnosis not present

## 2021-03-23 ENCOUNTER — Other Ambulatory Visit: Payer: Self-pay

## 2021-03-23 ENCOUNTER — Ambulatory Visit
Admission: RE | Admit: 2021-03-23 | Discharge: 2021-03-23 | Disposition: A | Discharge status: Home / Self Care | Payer: Medicare PPO | Source: Ambulatory Visit | Attending: Internal Medicine | Admitting: Internal Medicine

## 2021-03-23 DIAGNOSIS — E2839 Other primary ovarian failure: Secondary | ICD-10-CM

## 2021-03-23 DIAGNOSIS — Z780 Asymptomatic menopausal state: Secondary | ICD-10-CM | POA: Diagnosis not present

## 2021-03-24 ENCOUNTER — Other Ambulatory Visit: Payer: Self-pay | Admitting: Internal Medicine

## 2021-03-24 DIAGNOSIS — E2839 Other primary ovarian failure: Secondary | ICD-10-CM

## 2021-04-11 DIAGNOSIS — Z23 Encounter for immunization: Secondary | ICD-10-CM | POA: Diagnosis not present

## 2021-07-14 DIAGNOSIS — Z23 Encounter for immunization: Secondary | ICD-10-CM | POA: Diagnosis not present

## 2021-08-02 DIAGNOSIS — R079 Chest pain, unspecified: Secondary | ICD-10-CM | POA: Diagnosis not present

## 2021-12-07 ENCOUNTER — Other Ambulatory Visit: Payer: Self-pay | Admitting: Internal Medicine

## 2022-01-08 ENCOUNTER — Ambulatory Visit
Admission: RE | Admit: 2022-01-08 | Discharge: 2022-01-08 | Disposition: A | Discharge status: Home / Self Care | Payer: Medicare PPO | Source: Ambulatory Visit | Attending: Internal Medicine | Admitting: Internal Medicine

## 2022-01-08 DIAGNOSIS — Z1231 Encounter for screening mammogram for malignant neoplasm of breast: Secondary | ICD-10-CM

## 2022-04-11 DIAGNOSIS — I10 Essential (primary) hypertension: Secondary | ICD-10-CM | POA: Diagnosis not present

## 2022-04-11 DIAGNOSIS — D649 Anemia, unspecified: Secondary | ICD-10-CM | POA: Diagnosis not present

## 2022-04-11 DIAGNOSIS — Z0000 Encounter for general adult medical examination without abnormal findings: Secondary | ICD-10-CM | POA: Diagnosis not present

## 2022-04-11 DIAGNOSIS — R7309 Other abnormal glucose: Secondary | ICD-10-CM | POA: Diagnosis not present

## 2022-04-11 DIAGNOSIS — I471 Supraventricular tachycardia: Secondary | ICD-10-CM | POA: Diagnosis not present

## 2022-04-11 DIAGNOSIS — R002 Palpitations: Secondary | ICD-10-CM | POA: Diagnosis not present

## 2022-04-19 DIAGNOSIS — D649 Anemia, unspecified: Secondary | ICD-10-CM | POA: Diagnosis not present

## 2022-04-23 DIAGNOSIS — R002 Palpitations: Secondary | ICD-10-CM | POA: Diagnosis not present

## 2022-04-25 DIAGNOSIS — I471 Supraventricular tachycardia: Secondary | ICD-10-CM | POA: Diagnosis not present

## 2022-04-25 DIAGNOSIS — R002 Palpitations: Secondary | ICD-10-CM | POA: Diagnosis not present

## 2022-05-02 DIAGNOSIS — Z0001 Encounter for general adult medical examination with abnormal findings: Secondary | ICD-10-CM | POA: Diagnosis not present

## 2022-05-02 DIAGNOSIS — I10 Essential (primary) hypertension: Secondary | ICD-10-CM | POA: Diagnosis not present

## 2022-05-02 DIAGNOSIS — E663 Overweight: Secondary | ICD-10-CM | POA: Diagnosis not present

## 2022-05-02 DIAGNOSIS — Z0000 Encounter for general adult medical examination without abnormal findings: Secondary | ICD-10-CM | POA: Diagnosis not present

## 2022-05-02 DIAGNOSIS — Z6826 Body mass index (BMI) 26.0-26.9, adult: Secondary | ICD-10-CM | POA: Diagnosis not present

## 2022-07-07 DIAGNOSIS — Z23 Encounter for immunization: Secondary | ICD-10-CM | POA: Diagnosis not present

## 2022-08-31 DIAGNOSIS — R002 Palpitations: Secondary | ICD-10-CM | POA: Diagnosis not present

## 2022-08-31 DIAGNOSIS — H9202 Otalgia, left ear: Secondary | ICD-10-CM | POA: Diagnosis not present

## 2022-10-18 DIAGNOSIS — D649 Anemia, unspecified: Secondary | ICD-10-CM | POA: Diagnosis not present

## 2022-10-18 DIAGNOSIS — R002 Palpitations: Secondary | ICD-10-CM | POA: Diagnosis not present

## 2022-10-18 DIAGNOSIS — R7309 Other abnormal glucose: Secondary | ICD-10-CM | POA: Diagnosis not present

## 2022-10-18 DIAGNOSIS — I10 Essential (primary) hypertension: Secondary | ICD-10-CM | POA: Diagnosis not present

## 2022-10-18 DIAGNOSIS — R7303 Prediabetes: Secondary | ICD-10-CM | POA: Diagnosis not present

## 2022-10-24 DIAGNOSIS — M26622 Arthralgia of left temporomandibular joint: Secondary | ICD-10-CM | POA: Diagnosis not present

## 2022-10-24 DIAGNOSIS — H9202 Otalgia, left ear: Secondary | ICD-10-CM | POA: Diagnosis not present

## 2022-12-07 ENCOUNTER — Other Ambulatory Visit: Payer: Self-pay | Admitting: Internal Medicine

## 2022-12-07 DIAGNOSIS — Z1231 Encounter for screening mammogram for malignant neoplasm of breast: Secondary | ICD-10-CM

## 2022-12-25 DIAGNOSIS — Z01419 Encounter for gynecological examination (general) (routine) without abnormal findings: Secondary | ICD-10-CM | POA: Diagnosis not present

## 2023-01-21 ENCOUNTER — Emergency Department (HOSPITAL_COMMUNITY)
Admission: EM | Admit: 2023-01-21 | Discharge: 2023-01-21 | Disposition: A | Discharge status: Home / Self Care | Payer: Medicare PPO | Attending: Emergency Medicine | Admitting: Emergency Medicine

## 2023-01-21 ENCOUNTER — Emergency Department (HOSPITAL_COMMUNITY): Payer: Medicare PPO

## 2023-01-21 ENCOUNTER — Encounter (HOSPITAL_COMMUNITY): Payer: Self-pay

## 2023-01-21 DIAGNOSIS — R079 Chest pain, unspecified: Secondary | ICD-10-CM | POA: Diagnosis not present

## 2023-01-21 DIAGNOSIS — Z79899 Other long term (current) drug therapy: Secondary | ICD-10-CM | POA: Insufficient documentation

## 2023-01-21 DIAGNOSIS — J189 Pneumonia, unspecified organism: Secondary | ICD-10-CM | POA: Insufficient documentation

## 2023-01-21 DIAGNOSIS — R0789 Other chest pain: Secondary | ICD-10-CM | POA: Diagnosis not present

## 2023-01-21 LAB — BASIC METABOLIC PANEL
Anion gap: 9 (ref 5–15)
BUN: 18 mg/dL (ref 8–23)
CO2: 24 mmol/L (ref 22–32)
Calcium: 9.6 mg/dL (ref 8.9–10.3)
Chloride: 102 mmol/L (ref 98–111)
Creatinine, Ser: 0.97 mg/dL (ref 0.44–1.00)
GFR, Estimated: >60 mL/min (ref >60)
Glucose, Bld: 113 mg/dL — ABNORMAL HIGH (ref 70–99)
Potassium: 3.7 mmol/L (ref 3.5–5.1)
Sodium: 135 mmol/L (ref 135–145)

## 2023-01-21 LAB — CBC
HCT: 41 % (ref 36.0–46.0)
Hemoglobin: 12.3 g/dL (ref 12.0–15.0)
MCH: 22.6 pg — ABNORMAL LOW (ref 26.0–34.0)
MCHC: 30 g/dL (ref 30.0–36.0)
MCV: 75.4 fL — ABNORMAL LOW (ref 80.0–100.0)
Platelets: 204 10*3/uL (ref 150–400)
RBC: 5.44 MIL/uL — ABNORMAL HIGH (ref 3.87–5.11)
RDW: 14.5 % (ref 11.5–15.5)
WBC: 2.8 10*3/uL — ABNORMAL LOW (ref 4.0–10.5)
nRBC: 0 % (ref 0.0–0.2)

## 2023-01-21 LAB — TROPONIN I (HIGH SENSITIVITY): Troponin I (High Sensitivity): 3 ng/L (ref <18)

## 2023-01-21 MED ORDER — DOXYCYCLINE HYCLATE 100 MG PO CAPS: 100.0000 mg | ORAL_CAPSULE | Freq: Two times a day (BID) | ORAL | 0 refills | Status: DC

## 2023-01-21 NOTE — ED Triage Notes (Signed)
Pt c/o left sided chest pain. Pt states the first incident was on Saturday, and then again today- no pain on Sunday. Pt states she is more SHOB with regular activity- pt is SHOB walking from parking lot, states she is normally very active walking 4 miles/day multiple times a week.  Denies cough, nausea, vomiting.

## 2023-01-21 NOTE — ED Provider Notes (Signed)
[place] EMERGENCY DEPARTMENT AT [facility] Provider Note   CSN: [id number] Arrival date & time: 01/21/23  4098     History  Chief Complaint  Patient presents with   Chest Pain    Sandy Bennett is a 72 y.o. female.   Chest Pain Patient denies of chest pain shortness of breath.  On  Friday was normal and that her normal walking.  On Saturday developed left-sided chest pain.  States it was constant the whole time.  Some shortness of breath with it.  Normally walks 4 times a week.  States she did not walk on Saturday or Sunday.  States Sunday was feeling better but not back to normal.  States she did feel more short of breath walking in from the parking lot today.  Does have history of hypertension.  No known history of coronary artery disease.    Past Medical History:  Diagnosis Date   Diverticulosis    Hyperlipidemia    Mild obesity    MVP (mitral valve prolapse)     Home Medications Prior to Admission medications   Medication Sig Start Date End Date Taking? Authorizing Provider  doxycycline (VIBRAMYCIN) 100 MG capsule Take 1 capsule (100 mg total) by mouth 2 (two) times daily. 01/21/23  Yes Benjiman Core, MD  ibuprofen (ADVIL,MOTRIN) 800 MG tablet Take 800 mg by mouth every 8 (eight) hours as needed for headache.    [provider]  losartan-hydrochlorothiazide Mauri Reading) 50-12.5 MG tablet  04/04/20   [provider]  traMADol (ULTRAM) 50 MG tablet Take 1 tablet (50 mg total) by mouth every 6 (six) hours as needed. 07/05/20   Kathryne Hitch, MD      Allergies    Patient has no known allergies.    Review of Systems   Review of Systems  Cardiovascular:  Positive for chest pain.    Physical Exam Updated Vital Signs BP 134/70   Pulse 77   Temp 97.9 F (36.6 C)   Resp 20   Ht 5\' 8"  (1.727 m)   Wt 81.2 kg   SpO2 100%   BMI 27.22 kg/m  Physical Exam Vitals and nursing note reviewed.  Cardiovascular:     Rate and  Rhythm: Normal rate and regular rhythm.  Pulmonary:     Breath sounds: No wheezing or rhonchi.  Chest:     Chest wall: Tenderness present.     Comments: Mild tenderness to left anterior chest wall, however patient states this is not the same pain she was having. Musculoskeletal:     Right lower leg: No edema.     Left lower leg: No edema.  Skin:    Capillary Refill: Capillary refill takes less than 2 seconds.     ED Results / Procedures / Treatments   Labs (all labs ordered are listed, but only abnormal results are displayed) Labs Reviewed  BASIC METABOLIC PANEL - Abnormal; Notable for the following components:      Result Value   Glucose, Bld 113 (*)    All other components within normal limits  CBC - Abnormal; Notable for the following components:   WBC 2.8 (*)    RBC 5.44 (*)    MCV 75.4 (*)    MCH 22.6 (*)    All other components within normal limits  TROPONIN I (HIGH SENSITIVITY)  TROPONIN I (HIGH SENSITIVITY)    EKG EKG Interpretation  Date/Time:  Monday January 21 2023 09:26:09 EDT Ventricular Rate:  81 PR Interval:  160 QRS Duration: 92 QT Interval:  362 QTC Calculation: 421 R Axis:   68 Text Interpretation: Sinus rhythm No significant change since last tracing Confirmed by Benjiman Core [id number]) on 01/21/2023 9:33:42 AM  Radiology DG Chest 2 View  Result Date: 01/21/2023 CLINICAL DATA:  Chest pain EXAM: CHEST - 2 VIEW COMPARISON:  None Available. FINDINGS: No pleural effusion. No pneumothorax. There is a hazy opacity at the right lung base. Normal cardiac and mediastinal contours. No radiographically apparent displaced rib fractures. Visualized upper abdomen is unremarkable. Vertebral body heights are maintained. IMPRESSION: Hazy opacity at the right lung base is suspicious for infection. Electronically Signed   By: Lorenza Cambridge M.D.   On: 01/21/2023 10:23    Procedures Procedures    Medications Ordered in ED Medications - No data to display  ED  Course/ Medical Decision Making/ A&P                             Medical Decision Making Amount and/or Complexity of Data Reviewed Labs: ordered. Radiology: ordered.  Risk Prescription drug management.   Patient presents with chest pain.  Left anterior chest.  He is somewhat short of breath today.  No coughing.  No fevers.  EKG reassuring.  Differential diagnosis includes coronary disease, pneumonia, nonspecific chest pain.  Will get x-ray and basic blood work.  Blood work reassuring.  Troponin negative.  However x-ray does show potential pneumonia.  With the shortness of breath I think this is reasonable cause.  However really has not been coughing.  Troponin negative and has had pain since Saturday so doubt acute ischemia.  However with pain in the story does not necessarily fully support pneumonia I think it is reasonable for short-term follow-up with cardiology.  Since she says she cannot do the same activity she could previously.  However do not think we need admission in the hospital.  Will        Final Clinical Impression(s) / ED Diagnoses Final diagnoses:  Community acquired pneumonia, unspecified laterality  Nonspecific chest pain    Rx / DC Orders ED Discharge Orders          Ordered    Ambulatory referral to Cardiology       Comments: If you have not heard from the Cardiology office within the next 72 hours please call [phone].   01/21/23 1221    doxycycline (VIBRAMYCIN) 100 MG capsule  2 times daily        01/21/23 1221              Benjiman Core, MD 01/21/23 1223

## 2023-01-21 NOTE — Progress Notes (Unsigned)
CARDIOLOGY CONSULT NOTE       Patient ID: IRIDIAN READER MRN: [id number] DOB/AGE: September 12, 1951 72 y.o.  Admit date: (Not on file) Referring Physician: Delma Post ED Primary Physician: Renford Dills, MD Primary Cardiologist: new Reason for Consultation: Chest Pain  Active Problems:   * No active hospital problems. *   HPI:  72 y.o. referred by [facility] ER Dr Rubin Payor for chest pain. History of obesity, HLD , HTN and MVP.  Seen in ED 01/21/23 Developed SSCP Saturday Constant pain. She walks 4x/week and pain is new. Associated with some dyspnea CXR hazy opacity in right lung base suspicious for infection Rx with Vibramycin. R/O negative troponin no acute ECG changes  ECG NSR minor nonspecific inferior T wave changes similar to 2019  Labs remarkable otherwise for suppressed WBC 2.8   Prior smoker quit in 45'   She still has some dyspnea No clinical signs of pneumonia No fever cough sputum or chills SSCP gone She has a big trip to [place] planned 5/11   ROS All other systems reviewed and negative except as noted above  Past Medical History:  Diagnosis Date   Diverticulosis    Hyperlipidemia    Mild obesity    MVP (mitral valve prolapse)     Family History  Problem Relation Age of Onset   Cancer Mother        ? type   Breast cancer Sister 23   Breast cancer Sister 42   Colon polyps Neg Hx    Esophageal cancer Neg Hx    Stomach cancer Neg Hx     Social History   Socioeconomic History   Marital status: Married    Spouse name: Not on file   Number of children: Not on file   Years of education: Not on file   Highest education level: Not on file  Occupational History   Occupation: Producer, television/film/video: [employer]  Tobacco Use   Smoking status: Former    Packs/day: 0.50    Years: 10.00    Additional pack years: 0.00    Total pack years: 5.00    Types: Cigarettes    Quit date: 09/25/1983    Years since quitting: 39.3   Smokeless tobacco: Never   Substance and Sexual Activity   Alcohol use: No   Drug use: No   Sexual activity: Not on file  Other Topics Concern   Not on file  Social History Narrative   Not on file   Social Determinants of Health   Financial Resource Strain: Not on file  Food Insecurity: Not on file  Transportation Needs: Not on file  Physical Activity: Not on file  Stress: Not on file  Social Connections: Not on file  Intimate Partner Violence: Not on file    Past Surgical History:  Procedure Laterality Date   CARPAL TUNNEL RELEASE  2010   rt   CHOLECYSTECTOMY  1994   PARTIAL HYSTERECTOMY  1970   TONSILLECTOMY        Current Outpatient Medications:    doxycycline (VIBRAMYCIN) 100 MG capsule, Take 1 capsule (100 mg total) by mouth 2 (two) times daily., Disp: 10 capsule, Rfl: 0   losartan-hydrochlorothiazide (HYZAAR) 50-12.5 MG tablet, , Disp: , Rfl:    ibuprofen (ADVIL,MOTRIN) 800 MG tablet, Take 800 mg by mouth every 8 (eight) hours as needed for headache. (Patient not taking: Reported on 01/22/2023), Disp: , Rfl:    traMADol (ULTRAM) 50 MG tablet, Take 1 tablet (50  mg total) by mouth every 6 (six) hours as needed. (Patient not taking: Reported on 01/22/2023), Disp: 30 tablet, Rfl: 0    Physical Exam: Blood pressure 112/64, pulse 72, height 5\' 7"  (1.702 m), weight 174 lb (78.9 kg), SpO2 99 %.    Affect appropriate Overweight black female  HEENT: normal Neck supple with no adenopathy JVP normal no bruits no thyromegaly Lungs clear with no wheezing and good diaphragmatic motion Heart:  S1/S2 no murmur, no rub, gallop or click PMI normal Abdomen: benign prior GB removal and hysterectomy  Distal pulses intact with no bruits No edema Neuro non-focal Skin warm and dry No muscular weakness prior right carpal tunnel surgery    Labs:   Lab Results  Component Value Date   WBC 2.8 (L) 01/21/2023   HGB 12.3 01/21/2023   HCT 41.0 01/21/2023   MCV 75.4 (L) 01/21/2023   PLT 204 01/21/2023     Recent Labs  Lab 01/21/23 0924  NA 135  K 3.7  CL 102  CO2 24  BUN 18  CREATININE 0.97  CALCIUM 9.6  GLUCOSE 113*   No results found for: "CKTOTAL", "CKMB", "CKMBINDEX", "TROPONINI"  Lab Results  Component Value Date   CHOL 170 03/11/2018   CHOL 183 03/11/2017   CHOL 172 03/02/2016   Lab Results  Component Value Date   HDL 63.80 03/11/2018   HDL 59.70 03/11/2017   HDL 57.90 03/02/2016   Lab Results  Component Value Date   LDLCALC 90 03/11/2018   LDLCALC 110 (H) 03/11/2017   LDLCALC 103 (H) 03/02/2016   Lab Results  Component Value Date   TRIG 82.0 03/11/2018   TRIG 67.0 03/11/2017   TRIG 58.0 03/02/2016   Lab Results  Component Value Date   CHOLHDL 3 03/11/2018   CHOLHDL 3 03/11/2017   CHOLHDL 3 03/02/2016   No results found for: "LDLDIRECT"    Radiology: DG Chest 2 View  Result Date: 01/21/2023 CLINICAL DATA:  Chest pain EXAM: CHEST - 2 VIEW COMPARISON:  None Available. FINDINGS: No pleural effusion. No pneumothorax. There is a hazy opacity at the right lung base. Normal cardiac and mediastinal contours. No radiographically apparent displaced rib fractures. Visualized upper abdomen is unremarkable. Vertebral body heights are maintained. IMPRESSION: Hazy opacity at the right lung base is suspicious for infection. Electronically Signed   By: Lorenza Cambridge M.D.   On: 01/21/2023 10:23    EKG: See HPI   ASSESSMENT AND PLAN:   Chest pain:  somewhat atypical In setting of obesity, HTN and HLD ER w/u negative troponin and non acute ECG Shared decision making favor exercise myovue to risk stratify  Dyspnea:  in setting of possible pneumonia On Vibramycin WBC suppressed although fairly chronic ? Virus Should have f/u CXR or chest CT with primary TTE to assess RV/LV EF HTN:  Well controlled.  Continue current medications and low sodium Dash type diet.   HLD:  not on meds f/u with primary for labs and to discuss  Ex Myovue TTE   F/U PRN pending studies    Signed: Charlton Haws 01/22/2023, 12:19 PM

## 2023-01-21 NOTE — ED Notes (Signed)
Pt in bed, pt states that she is ready to go home, pt verbalized understanding d/c and follow up, pt from dpt.

## 2023-01-22 ENCOUNTER — Ambulatory Visit
Admission: RE | Admit: 2023-01-22 | Discharge: 2023-01-22 | Disposition: A | Discharge status: Home / Self Care | Payer: Medicare PPO | Source: Ambulatory Visit | Attending: Internal Medicine | Admitting: Internal Medicine

## 2023-01-22 ENCOUNTER — Telehealth (HOSPITAL_COMMUNITY): Payer: Self-pay | Admitting: *Deleted

## 2023-01-22 ENCOUNTER — Ambulatory Visit: Payer: Medicare PPO | Attending: Cardiovascular Disease | Admitting: Cardiovascular Disease

## 2023-01-22 ENCOUNTER — Encounter: Payer: Self-pay | Admitting: Cardiovascular Disease

## 2023-01-22 VITALS — BP 112/64 | HR 72 | Ht 67.0 in | Wt 174.0 lb

## 2023-01-22 DIAGNOSIS — R079 Chest pain, unspecified: Secondary | ICD-10-CM

## 2023-01-22 DIAGNOSIS — Z1231 Encounter for screening mammogram for malignant neoplasm of breast: Secondary | ICD-10-CM | POA: Diagnosis not present

## 2023-01-22 DIAGNOSIS — R0602 Shortness of breath: Secondary | ICD-10-CM | POA: Diagnosis not present

## 2023-01-22 NOTE — Patient Instructions (Signed)
Medication Instructions:  Your physician recommends that you continue on your current medications as directed. Please refer to the Current Medication list given to you today.  *If you need a refill on your cardiac medications before your next appointment, please call your pharmacy*  Lab Work: If you have labs (blood work) drawn today and your tests are completely normal, you will receive your results only by: MyChart Message (if you have MyChart) OR A paper copy in the mail If you have any lab test that is abnormal or we need to change your treatment, we will call you to review the results.  Testing/Procedures: Your physician has requested that you have an echocardiogram ASAP. Echocardiography is a painless test that uses sound waves to create images of your heart. It provides your doctor with information about the size and shape of your heart and how well your heart's chambers and valves are working. This procedure takes approximately one hour. There are no restrictions for this procedure. Please do NOT wear cologne, perfume, aftershave, or lotions (deodorant is allowed). Please arrive 15 minutes prior to your appointment time.  Your physician has requested that you have en exercise stress myoview ASAP. For further information please visit [email or web address] Please follow instruction sheet, as given.  Follow-Up: At [facility], you and your health needs are our priority.  As part of our continuing mission to provide you with exceptional heart care, we have created designated Provider Care Teams.  These Care Teams include your primary Cardiologist (physician) and Advanced Practice Providers (APPs -  Physician Assistants and Nurse Practitioners) who all work together to provide you with the care you need, when you need it.  We recommend signing up for the patient portal called "MyChart".  Sign up information is provided on this After Visit Summary.  MyChart is used to connect with  patients for Virtual Visits (Telemedicine).  Patients are able to view lab/test results, encounter notes, upcoming appointments, etc.  Non-urgent messages can be sent to your provider as well.   To learn more about what you can do with MyChart, go to [email or web address].    Your next appointment:   2 month(s)  Provider:   Charlton Haws, MD

## 2023-01-22 NOTE — Telephone Encounter (Signed)
Pt reached and given instructions for MPI study on 01/23/23.

## 2023-01-23 ENCOUNTER — Ambulatory Visit (HOSPITAL_COMMUNITY): Payer: Medicare PPO | Attending: Cardiovascular Disease

## 2023-01-23 DIAGNOSIS — R0602 Shortness of breath: Secondary | ICD-10-CM | POA: Diagnosis not present

## 2023-01-23 DIAGNOSIS — R079 Chest pain, unspecified: Secondary | ICD-10-CM | POA: Insufficient documentation

## 2023-01-23 LAB — MYOCARDIAL PERFUSION IMAGING
LV dias vol: 45 mL (ref 46–106)
LV sys vol: 14 mL
Nuc Stress EF: 68 %
Peak HR: 127 {beats}/min
Rest HR: 65 {beats}/min
Rest Nuclear Isotope Dose: 10.8 mCi
SDS: 2
SRS: 0
SSS: 2
ST Depression (mm): 0 mm
Stress Nuclear Isotope Dose: 32 mCi
TID: 1.06

## 2023-01-23 MED ORDER — REGADENOSON 0.4 MG/5ML IV SOLN
0.4000 mg | Freq: Once | INTRAVENOUS | Status: AC
Start: 2023-01-23 — End: 2023-01-23
  Administered 2023-01-23: 0.4 mg via INTRAVENOUS

## 2023-01-23 MED ORDER — TECHNETIUM TC 99M TETROFOSMIN IV KIT
32.0000 | PACK | Freq: Once | INTRAVENOUS | Status: AC | PRN
  Administered 2023-01-23: 32 via INTRAVENOUS

## 2023-01-23 MED ORDER — TECHNETIUM TC 99M TETROFOSMIN IV KIT
10.8000 | PACK | Freq: Once | INTRAVENOUS | Status: AC | PRN
  Administered 2023-01-23: 10.8 via INTRAVENOUS

## 2023-01-24 ENCOUNTER — Ambulatory Visit (INDEPENDENT_AMBULATORY_CARE_PROVIDER_SITE_OTHER): Payer: Medicare PPO

## 2023-01-24 DIAGNOSIS — R0600 Dyspnea, unspecified: Secondary | ICD-10-CM | POA: Diagnosis not present

## 2023-01-24 DIAGNOSIS — R079 Chest pain, unspecified: Secondary | ICD-10-CM

## 2023-01-24 DIAGNOSIS — R0602 Shortness of breath: Secondary | ICD-10-CM | POA: Diagnosis not present

## 2023-01-24 DIAGNOSIS — J189 Pneumonia, unspecified organism: Secondary | ICD-10-CM | POA: Diagnosis not present

## 2023-01-24 DIAGNOSIS — F419 Anxiety disorder, unspecified: Secondary | ICD-10-CM | POA: Diagnosis not present

## 2023-01-24 LAB — ECHOCARDIOGRAM COMPLETE
Area-P 1/2: 4.89 cm2
P 1/2 time: 537 msec
S' Lateral: 2.05 cm

## 2023-02-19 ENCOUNTER — Encounter (HOSPITAL_COMMUNITY): Payer: Medicare PPO

## 2023-04-29 ENCOUNTER — Ambulatory Visit: Payer: Medicare PPO | Admitting: Cardiovascular Disease

## 2023-05-29 DIAGNOSIS — Z79899 Other long term (current) drug therapy: Secondary | ICD-10-CM | POA: Diagnosis not present

## 2023-05-29 DIAGNOSIS — Z0000 Encounter for general adult medical examination without abnormal findings: Secondary | ICD-10-CM | POA: Diagnosis not present

## 2023-05-29 DIAGNOSIS — I10 Essential (primary) hypertension: Secondary | ICD-10-CM | POA: Diagnosis not present

## 2023-05-29 DIAGNOSIS — D649 Anemia, unspecified: Secondary | ICD-10-CM | POA: Diagnosis not present

## 2023-05-29 DIAGNOSIS — M79676 Pain in unspecified toe(s): Secondary | ICD-10-CM | POA: Diagnosis not present

## 2023-05-29 DIAGNOSIS — Z5181 Encounter for therapeutic drug level monitoring: Secondary | ICD-10-CM | POA: Diagnosis not present

## 2023-05-29 DIAGNOSIS — R7309 Other abnormal glucose: Secondary | ICD-10-CM | POA: Diagnosis not present

## 2023-05-31 DIAGNOSIS — D649 Anemia, unspecified: Secondary | ICD-10-CM | POA: Diagnosis not present

## 2023-05-31 DIAGNOSIS — Z23 Encounter for immunization: Secondary | ICD-10-CM | POA: Diagnosis not present

## 2023-06-25 DIAGNOSIS — D649 Anemia, unspecified: Secondary | ICD-10-CM | POA: Diagnosis not present

## 2023-06-25 DIAGNOSIS — I10 Essential (primary) hypertension: Secondary | ICD-10-CM | POA: Diagnosis not present

## 2023-06-25 DIAGNOSIS — D560 Alpha thalassemia: Secondary | ICD-10-CM | POA: Diagnosis not present

## 2023-07-13 DIAGNOSIS — Z23 Encounter for immunization: Secondary | ICD-10-CM | POA: Diagnosis not present

## 2023-09-25 ENCOUNTER — Emergency Department (HOSPITAL_COMMUNITY): Payer: Medicare PPO

## 2023-09-25 ENCOUNTER — Other Ambulatory Visit: Payer: Self-pay

## 2023-09-25 ENCOUNTER — Emergency Department (HOSPITAL_COMMUNITY)
Admission: EM | Admit: 2023-09-25 | Discharge: 2023-09-25 | Disposition: A | Discharge status: Home / Self Care | Payer: Medicare PPO | Attending: Emergency Medicine | Admitting: Emergency Medicine

## 2023-09-25 ENCOUNTER — Encounter (HOSPITAL_COMMUNITY): Payer: Self-pay | Admitting: Emergency Medicine

## 2023-09-25 DIAGNOSIS — R072 Precordial pain: Secondary | ICD-10-CM | POA: Diagnosis not present

## 2023-09-25 DIAGNOSIS — Z79899 Other long term (current) drug therapy: Secondary | ICD-10-CM | POA: Diagnosis not present

## 2023-09-25 DIAGNOSIS — R079 Chest pain, unspecified: Secondary | ICD-10-CM | POA: Diagnosis not present

## 2023-09-25 DIAGNOSIS — I10 Essential (primary) hypertension: Secondary | ICD-10-CM | POA: Insufficient documentation

## 2023-09-25 DIAGNOSIS — R0789 Other chest pain: Secondary | ICD-10-CM | POA: Diagnosis present

## 2023-09-25 LAB — BASIC METABOLIC PANEL
Anion gap: 12 (ref 5–15)
BUN: 14 mg/dL (ref 8–23)
CO2: 24 mmol/L (ref 22–32)
Calcium: 9.7 mg/dL (ref 8.9–10.3)
Chloride: 106 mmol/L (ref 98–111)
Creatinine, Ser: 1.14 mg/dL — ABNORMAL HIGH (ref 0.44–1.00)
GFR, Estimated: 51 mL/min — ABNORMAL LOW (ref >60)
Glucose, Bld: 159 mg/dL — ABNORMAL HIGH (ref 70–99)
Potassium: 3.5 mmol/L (ref 3.5–5.1)
Sodium: 142 mmol/L (ref 135–145)

## 2023-09-25 LAB — CBC
HCT: 39 % (ref 36.0–46.0)
Hemoglobin: 11.6 g/dL — ABNORMAL LOW (ref 12.0–15.0)
MCH: 22.2 pg — ABNORMAL LOW (ref 26.0–34.0)
MCHC: 29.7 g/dL — ABNORMAL LOW (ref 30.0–36.0)
MCV: 74.7 fL — ABNORMAL LOW (ref 80.0–100.0)
Platelets: 199 10*3/uL (ref 150–400)
RBC: 5.22 MIL/uL — ABNORMAL HIGH (ref 3.87–5.11)
RDW: 14.6 % (ref 11.5–15.5)
WBC: 3.1 10*3/uL — ABNORMAL LOW (ref 4.0–10.5)
nRBC: 0 % (ref 0.0–0.2)

## 2023-09-25 LAB — TROPONIN I (HIGH SENSITIVITY)
Troponin I (High Sensitivity): 4 ng/L (ref <18)
Troponin I (High Sensitivity): 6 ng/L (ref <18)

## 2023-09-25 MED ORDER — ALUM & MAG HYDROXIDE-SIMETH 200-200-20 MG/5ML PO SUSP
30.0000 mL | Freq: Once | ORAL | Status: AC
  Administered 2023-09-25: 30 mL via ORAL
  Filled 2023-09-25: qty 30

## 2023-09-25 MED ORDER — FAMOTIDINE 20 MG PO TABS
20.0000 mg | ORAL_TABLET | Freq: Once | ORAL | Status: AC
  Administered 2023-09-25: 20 mg via ORAL
  Filled 2023-09-25: qty 1

## 2023-09-25 MED ORDER — ACETAMINOPHEN 500 MG PO TABS
1000.0000 mg | ORAL_TABLET | Freq: Once | ORAL | Status: AC
  Administered 2023-09-25: 1000 mg via ORAL
  Filled 2023-09-25: qty 2

## 2023-09-25 NOTE — ED Notes (Signed)
This RN reviewed discharge instructions with patient. She verbalized understanding and denied any further questions. PT well appearing upon discharge and reports tolerable pain. Pt wheeled to exit. Pt endorses ride home.  

## 2023-09-25 NOTE — ED Provider Triage Note (Signed)
 Emergency Medicine Provider Triage Evaluation Note  Sandy Bennett , a 73 y.o. female  was evaluated in triage.  Pt complains of chest pain. Onset while eating breakfast. Burning/Pressure. Non-radiating. Associated SHOB. Symptoms are improving.   Review of Systems  Positive: CP  Negative: Fevers   Physical Exam  BP 131/79 (BP Location: Left Arm)   Pulse 86   Temp 97.8 F (36.6 C) (Oral)   Resp 18   Ht 5' 8 (1.727 m)   Wt 83.9 kg   SpO2 100%   BMI 28.13 kg/m  Gen:   Awake, no distress   Resp:  Normal effort, clear bilaterally.  MSK:   Moves extremities without difficulty  Other:  Equal radial pulses  Medical Decision Making  Medically screening exam initiated at 12:09 PM.  Appropriate orders placed.  Sandy Bennett was informed that the remainder of the evaluation will be completed by another provider, this initial triage assessment does not replace that evaluation, and the importance of remaining in the ED until their evaluation is complete.  73 year old presents with atypical chest pain.  EKG without STEMI.  Clear lungs bilaterally and equal pulses.  Will get screening labs.  Will give GI cocktail as symptoms seemingly started while eating described as a burning sensation. Symptoms maybe reflux.    Sandy Caron PARAS, DO 09/25/23 1211

## 2023-09-25 NOTE — Discharge Instructions (Addendum)
 It was our pleasure to provide your ER care today - we hope that you feel better.  If GI/reflux symptoms, try taking pepcid  and maalox as need for symptom relief.   For recent chest pain, follow up with cardiologist in the next 1-2 weeks.   Return to ER if worse, new symptoms, fevers, recurrent/persistent chest pain, increased trouble breathing, or other concern.

## 2023-09-25 NOTE — ED Provider Notes (Signed)
 [place] EMERGENCY DEPARTMENT AT [facility] Provider Note   CSN: [id number] Arrival date & time: 09/25/23  1147     History  Chief Complaint  Patient presents with   Chest Pain    Sandy Bennett is a 73 y.o. female.  Pt with c/o midline, lower chest pressure sensation, at rest, onset this AM. No exertional chest pain or discomfort. No sob, nv or diaphoresis. No pleuritic pain. Denies any recent exertional cp, or unusual doe. No hx cad, or fam hx cad. No cough or uri symptoms. No chest wall injury or strain. No hx gerd. No hx dvt or pe. No leg pain or swelling.   The history is provided by the patient, medical records and a relative.  Chest Pain Associated symptoms: no abdominal pain, no back pain, no cough, no fever, no headache, no nausea, no palpitations, no shortness of breath and no vomiting        Home Medications Prior to Admission medications   Medication Sig Start Date End Date Taking? Authorizing Provider  doxycycline  (VIBRAMYCIN ) 100 MG capsule Take 1 capsule (100 mg total) by mouth 2 (two) times daily. 01/21/23   Patsey Lot, MD  losartan-hydrochlorothiazide (HYZAAR) 50-12.5 MG tablet  04/04/20   [provider]  traMADol  (ULTRAM ) 50 MG tablet Take 1 tablet (50 mg total) by mouth every 6 (six) hours as needed. Patient not taking: Reported on 01/22/2023 07/05/20   Vernetta Lonni GRADE, MD      Allergies    Patient has no known allergies.    Review of Systems   Review of Systems  Constitutional:  Negative for chills and fever.  HENT:  Negative for sore throat.   Eyes:  Negative for redness.  Respiratory:  Negative for cough and shortness of breath.   Cardiovascular:  Positive for chest pain. Negative for palpitations and leg swelling.  Gastrointestinal:  Negative for abdominal pain, nausea and vomiting.  Genitourinary:  Negative for flank pain.  Musculoskeletal:  Negative for back pain and neck pain.  Skin:  Negative for rash.   Neurological:  Negative for headaches.    Physical Exam Updated Vital Signs BP (!) 153/72 (BP Location: Right Arm)   Pulse 82   Temp 97.6 F (36.4 C) (Oral)   Resp 18   Ht 1.727 m (5' 8)   Wt 83.9 kg   SpO2 100%   BMI 28.13 kg/m  Physical Exam Vitals and nursing note reviewed.  Constitutional:      Appearance: Normal appearance. She is well-developed.  HENT:     Head: Atraumatic.     Nose: Nose normal.     Mouth/Throat:     Mouth: Mucous membranes are moist.     Pharynx: Oropharynx is clear. No oropharyngeal exudate or posterior oropharyngeal erythema.  Eyes:     General: No scleral icterus.    Conjunctiva/sclera: Conjunctivae normal.  Neck:     Trachea: No tracheal deviation.  Cardiovascular:     Rate and Rhythm: Normal rate and regular rhythm.     Pulses: Normal pulses.     Heart sounds: Normal heart sounds. No murmur heard.    No friction rub. No gallop.  Pulmonary:     Effort: Pulmonary effort is normal. No respiratory distress.     Breath sounds: Normal breath sounds.  Chest:     Chest wall: No tenderness.  Abdominal:     General: There is no distension.     Palpations: Abdomen is soft.  Tenderness: There is no abdominal tenderness.  Musculoskeletal:        General: No swelling or tenderness.     Cervical back: Normal range of motion and neck supple. No rigidity. No muscular tenderness.     Right lower leg: No edema.     Left lower leg: No edema.  Skin:    General: Skin is warm and dry.     Findings: No rash.  Neurological:     Mental Status: She is alert.     Comments: Alert, speech normal.   Psychiatric:        Mood and Affect: Mood normal.     ED Results / Procedures / Treatments   Labs (all labs ordered are listed, but only abnormal results are displayed) Results for orders placed or performed during the hospital encounter of 09/25/23  Basic metabolic panel   Collection Time: 09/25/23 11:58 AM  Result Value Ref Range   Sodium 142 135 -  145 mmol/L   Potassium 3.5 3.5 - 5.1 mmol/L   Chloride 106 98 - 111 mmol/L   CO2 24 22 - 32 mmol/L   Glucose, Bld 159 (H) 70 - 99 mg/dL   BUN 14 8 - 23 mg/dL   Creatinine, Ser 8.85 (H) 0.44 - 1.00 mg/dL   Calcium 9.7 8.9 - 89.6 mg/dL   GFR, Estimated 51 (L) >60 mL/min   Anion gap 12 5 - 15  CBC   Collection Time: 09/25/23 11:58 AM  Result Value Ref Range   WBC 3.1 (L) 4.0 - 10.5 K/uL   RBC 5.22 (H) 3.87 - 5.11 MIL/uL   Hemoglobin 11.6 (L) 12.0 - 15.0 g/dL   HCT 60.9 63.9 - 53.9 %   MCV 74.7 (L) 80.0 - 100.0 fL   MCH 22.2 (L) 26.0 - 34.0 pg   MCHC 29.7 (L) 30.0 - 36.0 g/dL   RDW 85.3 88.4 - 84.4 %   Platelets 199 150 - 400 K/uL   nRBC 0.0 0.0 - 0.2 %  Troponin I (High Sensitivity)   Collection Time: 09/25/23 11:58 AM  Result Value Ref Range   Troponin I (High Sensitivity) 4 <18 ng/L  Troponin I (High Sensitivity)   Collection Time: 09/25/23  4:03 PM  Result Value Ref Range   Troponin I (High Sensitivity) 6 <18 ng/L   DG Chest 2 View Result Date: 09/25/2023 CLINICAL DATA:  chest pain EXAM: CHEST - 2 VIEW COMPARISON:  01/21/2023 FINDINGS: Lungs are clear.  No pneumothorax. Heart size and mediastinal contours are within normal limits. Aortic Atherosclerosis (ICD10-170.0). No effusion. Visualized bones unremarkable. IMPRESSION: No acute cardiopulmonary disease. Electronically Signed   By: JONETTA Faes M.D.   On: 09/25/2023 12:55    EKG EKG Interpretation Date/Time:  Wednesday September 25 2023 12:02:52 EST Ventricular Rate:  87 PR Interval:  174 QRS Duration:  78 QT Interval:  364 QTC Calculation: 438 R Axis:   64  Text Interpretation: Normal sinus rhythm Nonspecific T wave abnormality Confirmed by Bernard Drivers ([id number]) on 09/25/2023 3:32:08 PM  Radiology DG Chest 2 View Result Date: 09/25/2023 CLINICAL DATA:  chest pain EXAM: CHEST - 2 VIEW COMPARISON:  01/21/2023 FINDINGS: Lungs are clear.  No pneumothorax. Heart size and mediastinal contours are within normal limits. Aortic  Atherosclerosis (ICD10-170.0). No effusion. Visualized bones unremarkable. IMPRESSION: No acute cardiopulmonary disease. Electronically Signed   By: JONETTA Faes M.D.   On: 09/25/2023 12:55    Procedures Procedures    Medications Ordered in ED Medications  alum & mag hydroxide-simeth (MAALOX/MYLANTA) 200-200-20 MG/5ML suspension 30 mL (30 mLs Oral Given 09/25/23 1226)  alum & mag hydroxide-simeth (MAALOX/MYLANTA) 200-200-20 MG/5ML suspension 30 mL (30 mLs Oral Given 09/25/23 1614)  famotidine  (PEPCID ) tablet 20 mg (20 mg Oral Given 09/25/23 1614)  acetaminophen  (TYLENOL ) tablet 1,000 mg (1,000 mg Oral Given 09/25/23 1614)    ED Course/ Medical Decision Making/ A&P                                 Medical Decision Making Problems Addressed: Essential hypertension: chronic illness or injury that poses a threat to life or bodily functions Precordial chest pain: acute illness or injury with systemic symptoms that poses a threat to life or bodily functions  Amount and/or Complexity of Data Reviewed Independent Historian:     Details: Fam, hx External Data Reviewed: notes. Labs: ordered. Decision-making details documented in ED Course. Radiology: ordered and independent interpretation performed. Decision-making details documented in ED Course.  Risk OTC drugs. Decision regarding hospitalization.   Iv ns. Continuous pulse ox and cardiac monitoring. Labs ordered/sent. Imaging ordered.   Differential diagnosis includes acs, msk cp, gi cp, etc. Dispo decision including potential need for admission considered - will get labs and imaging and reassess.   Reviewed nursing notes and prior charts for additional history. External reports reviewed. Additional history from: family.   Cardiac monitor: sinus rhythm, rate 80  Labs reviewed/interpreted by me - hct normal. Trop normal.   Acetaminophen  po, pepcid  po, maalox po, for symptom relief.   Xrays reviewed/interpreted by me - no pna.    Additional labs reviewed/interpreted by me - delta trop normal and not significantly increasing, felt not c/w acs.   Rec close pcp/cardiology f/u.  Return precautions provided.          Final Clinical Impression(s) / ED Diagnoses Final diagnoses:  Precordial chest pain  Essential hypertension    Rx / DC Orders ED Discharge Orders          Ordered    Ambulatory referral to Cardiology       Comments: If you have not heard from the Cardiology office within the next 72 hours please call [phone].   09/25/23 1547              Bernard Drivers, MD 09/25/23 1801

## 2023-09-25 NOTE — ED Triage Notes (Signed)
 BIB EMS from home chest pressure, burning sensation and belching around 1045 while eating breakfast. Pressure is improving. Initially had some SOB which has subsided.  EMS VS 324mg  asa  130/90 P-98 RR-18 100%RA

## 2023-09-27 DIAGNOSIS — Z860100 Personal history of colon polyps, unspecified: Secondary | ICD-10-CM | POA: Diagnosis not present

## 2023-09-27 DIAGNOSIS — K219 Gastro-esophageal reflux disease without esophagitis: Secondary | ICD-10-CM | POA: Diagnosis not present

## 2023-09-30 DIAGNOSIS — B372 Candidiasis of skin and nail: Secondary | ICD-10-CM | POA: Diagnosis not present

## 2023-10-15 DIAGNOSIS — N9089 Other specified noninflammatory disorders of vulva and perineum: Secondary | ICD-10-CM | POA: Diagnosis not present

## 2023-10-24 DIAGNOSIS — Z860101 Personal history of adenomatous and serrated colon polyps: Secondary | ICD-10-CM | POA: Diagnosis not present

## 2023-10-24 DIAGNOSIS — R1310 Dysphagia, unspecified: Secondary | ICD-10-CM | POA: Diagnosis not present

## 2023-11-05 DIAGNOSIS — N9089 Other specified noninflammatory disorders of vulva and perineum: Secondary | ICD-10-CM | POA: Diagnosis not present

## 2023-11-07 DIAGNOSIS — N9089 Other specified noninflammatory disorders of vulva and perineum: Secondary | ICD-10-CM | POA: Diagnosis not present

## 2023-11-07 DIAGNOSIS — L280 Lichen simplex chronicus: Secondary | ICD-10-CM | POA: Diagnosis not present

## 2023-11-18 NOTE — Progress Notes (Signed)
 CARDIOLOGY CONSULT NOTE       Patient ID: Sandy Bennett MRN: [id number] DOB/AGE: 05/26/1951 73 y.o.  Referring Physician: [facility] Post ED Primary Physician: Renford Dills, MD Primary Cardiologist: Eden Emms   HPI:  73 y.o. referred by [facility] ER Dr Rubin Payor for chest pain.First seen 01/22/23  History of obesity, HLD , HTN and MVP.  Seen in ED 01/21/23 Developed SSCP Saturday Constant pain. She walks 4x/week and pain is new. Associated with some dyspnea CXR hazy opacity in right lung base suspicious for infection Rx with Vibramycin. R/O negative troponin no acute ECG changes  ECG NSR minor nonspecific inferior T wave changes similar to 2019  Labs remarkable otherwise for suppressed WBC 2.8   Prior smoker quit in 85'   Myovue normal 01/23/23 EF 68% no ischemia Echo 01/23/23 EF 60-65% mild AR   Had a nice trip to [place] latter in May 2024 after good cardiac tests  Seen in ED again 09/25/23 for chest pain. Midline at rest No associated symptoms. R/O no acute ECG changes CXR NAD Given pepcid and GI cocktail   Discussed fact that her echo and myovue were normal May 2024 Can f/u with anatomical test with calcium score and cardiac CTA if she has more symptoms Has been perfectly fine on protonix since ER visit  Walking 4 miles/day with no symptoms  ROS All other systems reviewed and negative except as noted above  Past Medical History:  Diagnosis Date   Diverticulosis    Hyperlipidemia    Mild obesity    MVP (mitral valve prolapse)     Family History  Problem Relation Age of Onset   Cancer Mother        ? type   Breast cancer Sister 38   Breast cancer Sister 70   Colon polyps Neg Hx    Esophageal cancer Neg Hx    Stomach cancer Neg Hx     Social History   Socioeconomic History   Marital status: Married    Spouse name: Not on file   Number of children: Not on file   Years of education: Not on file   Highest education level: Not on file  Occupational History    Occupation: Producer, television/film/video: [employer]  Tobacco Use   Smoking status: Former    Current packs/day: 0.00    Average packs/day: 0.5 packs/day for 10.0 years (5.0 ttl pk-yrs)    Types: Cigarettes    Start date: 09/24/1973    Quit date: 09/25/1983    Years since quitting: 40.1   Smokeless tobacco: Never  Substance and Sexual Activity   Alcohol use: No   Drug use: No   Sexual activity: Not on file  Other Topics Concern   Not on file  Social History Narrative   Not on file   Social Drivers of Health   Financial Resource Strain: Not on file  Food Insecurity: Not on file  Transportation Needs: Not on file  Physical Activity: Not on file  Stress: Not on file  Social Connections: Not on file  Intimate Partner Violence: Not on file    Past Surgical History:  Procedure Laterality Date   CARPAL TUNNEL RELEASE  2010   rt   CHOLECYSTECTOMY  1994   PARTIAL HYSTERECTOMY  1970   TONSILLECTOMY        Current Outpatient Medications:    losartan-hydrochlorothiazide (HYZAAR) 50-12.5 MG tablet, Take 1 tablet by mouth daily., Disp: , Rfl:    NON FORMULARY,  Take 1 capsule by mouth See admin instructions. Sports [employer] Vitamin D3 + K2 with Coconut MCT Oil  Vegan D3 5000 IU with K2 100 mcg- Take 1 capsule by mouth once a day, Disp: , Rfl:    pantoprazole (PROTONIX) 40 MG tablet, Take 40 mg by mouth daily., Disp: , Rfl:     Physical Exam: Blood pressure 122/74, pulse 77, height 5\' 8"  (1.727 m), weight 178 lb 6.4 oz (80.9 kg), SpO2 99%.    Affect appropriate Overweight black female  HEENT: normal Neck supple with no adenopathy JVP normal no bruits no thyromegaly Lungs clear with no wheezing and good diaphragmatic motion Heart:  S1/S2 no murmur, no rub, gallop or click PMI normal Abdomen: benign prior GB removal and hysterectomy  Distal pulses intact with no bruits No edema Neuro non-focal Skin warm and dry No muscular weakness prior right carpal tunnel surgery     Labs:   Lab Results  Component Value Date   WBC 3.1 (L) 09/25/2023   HGB 11.6 (L) 09/25/2023   HCT 39.0 09/25/2023   MCV 74.7 (L) 09/25/2023   PLT 199 09/25/2023    No results for input(s): "NA", "K", "CL", "CO2", "BUN", "CREATININE", "CALCIUM", "PROT", "BILITOT", "ALKPHOS", "ALT", "AST", "GLUCOSE" in the last 168 hours.  Invalid input(s): "LABALBU"  No results found for: "CKTOTAL", "CKMB", "CKMBINDEX", "TROPONINI"  Lab Results  Component Value Date   CHOL 170 03/11/2018   CHOL 183 03/11/2017   CHOL 172 03/02/2016   Lab Results  Component Value Date   HDL 63.80 03/11/2018   HDL 59.70 03/11/2017   HDL 57.90 03/02/2016   Lab Results  Component Value Date   LDLCALC 90 03/11/2018   LDLCALC 110 (H) 03/11/2017   LDLCALC 103 (H) 03/02/2016   Lab Results  Component Value Date   TRIG 82.0 03/11/2018   TRIG 67.0 03/11/2017   TRIG 58.0 03/02/2016   Lab Results  Component Value Date   CHOLHDL 3 03/11/2018   CHOLHDL 3 03/11/2017   CHOLHDL 3 03/02/2016   No results found for: "LDLDIRECT"    Radiology: No results found.  EKG: See HPI   ASSESSMENT AND PLAN:   Chest pain:  atypical Normal myovue and echo 01/23/23 Recurrent pain requiring ER visit She has not had any recurrence since ER on protonix Observe will order cardiac CTA in future for any recurrent symptoms HTN:  Well controlled.  Continue current medications and low sodium Dash type diet.   HLD:  not on meds f/u with primary for labs and to discuss  F/U  in 6 months  Signed: Charlton Haws 11/26/2023, 8:41 AM

## 2023-11-26 ENCOUNTER — Encounter: Payer: Self-pay | Admitting: Cardiovascular Disease

## 2023-11-26 ENCOUNTER — Ambulatory Visit: Payer: Medicare PPO | Attending: Cardiovascular Disease | Admitting: Cardiovascular Disease

## 2023-11-26 VITALS — BP 122/74 | HR 77 | Ht 68.0 in | Wt 178.4 lb

## 2023-11-26 DIAGNOSIS — I10 Essential (primary) hypertension: Secondary | ICD-10-CM | POA: Diagnosis not present

## 2023-11-26 DIAGNOSIS — R079 Chest pain, unspecified: Secondary | ICD-10-CM | POA: Diagnosis not present

## 2023-11-26 NOTE — Patient Instructions (Addendum)
 Medication Instructions:  Your physician recommends that you continue on your current medications as directed. Please refer to the Current Medication list given to you today.  *If you need a refill on your cardiac medications before your next appointment, please call your pharmacy*  Lab Work: If you have labs (blood work) drawn today and your tests are completely normal, you will receive your results only by: MyChart Message (if you have MyChart) OR A paper copy in the mail If you have any lab test that is abnormal or we need to change your treatment, we will call you to review the results.  Follow-Up: At [facility], you and your health needs are our priority.  As part of our continuing mission to provide you with exceptional heart care, we have created designated Provider Care Teams.  These Care Teams include your primary Cardiologist (physician) and Advanced Practice Providers (APPs -  Physician Assistants and Nurse Practitioners) who all work together to provide you with the care you need, when you need it.  We recommend signing up for the patient portal called "MyChart".  Sign up information is provided on this After Visit Summary.  MyChart is used to connect with patients for Virtual Visits (Telemedicine).  Patients are able to view lab/test results, encounter notes, upcoming appointments, etc.  Non-urgent messages can be sent to your provider as well.   To learn more about what you can do with MyChart, go to [email or web address].    Your next appointment:   6 months  Provider:   Charlton Haws, MD     Other Instructions       1st Floor: - Lobby - Registration  - Pharmacy  - Lab - Cafe  2nd Floor: - PV Lab - Diagnostic Testing (echo, CT, nuclear med)  3rd Floor: - Vacant  4th Floor: - TCTS (cardiothoracic surgery) - AFib Clinic - Structural Heart Clinic - Vascular Surgery  - Vascular Ultrasound  5th Floor: - HeartCare Cardiology (general and EP) -  Clinical Pharmacy for coumadin, hypertension, lipid, weight-loss medications, and med management appointments    Valet parking services will be available as well.

## 2023-11-27 DIAGNOSIS — K219 Gastro-esophageal reflux disease without esophagitis: Secondary | ICD-10-CM | POA: Diagnosis not present

## 2023-11-27 DIAGNOSIS — I10 Essential (primary) hypertension: Secondary | ICD-10-CM | POA: Diagnosis not present

## 2023-11-27 DIAGNOSIS — D560 Alpha thalassemia: Secondary | ICD-10-CM | POA: Diagnosis not present

## 2023-11-27 DIAGNOSIS — R7303 Prediabetes: Secondary | ICD-10-CM | POA: Diagnosis not present

## 2023-11-27 DIAGNOSIS — R0789 Other chest pain: Secondary | ICD-10-CM | POA: Diagnosis not present

## 2023-12-25 ENCOUNTER — Other Ambulatory Visit: Payer: Self-pay | Admitting: Internal Medicine

## 2023-12-25 DIAGNOSIS — Z0000 Encounter for general adult medical examination without abnormal findings: Secondary | ICD-10-CM

## 2024-01-03 DIAGNOSIS — K2930 Chronic superficial gastritis without bleeding: Secondary | ICD-10-CM | POA: Diagnosis not present

## 2024-01-03 DIAGNOSIS — K5730 Diverticulosis of large intestine without perforation or abscess without bleeding: Secondary | ICD-10-CM | POA: Diagnosis not present

## 2024-01-03 DIAGNOSIS — Z860100 Personal history of colon polyps, unspecified: Secondary | ICD-10-CM | POA: Diagnosis not present

## 2024-01-03 DIAGNOSIS — K2289 Other specified disease of esophagus: Secondary | ICD-10-CM | POA: Diagnosis not present

## 2024-01-03 DIAGNOSIS — R1310 Dysphagia, unspecified: Secondary | ICD-10-CM | POA: Diagnosis not present

## 2024-01-03 DIAGNOSIS — K2970 Gastritis, unspecified, without bleeding: Secondary | ICD-10-CM | POA: Diagnosis not present

## 2024-01-03 DIAGNOSIS — D120 Benign neoplasm of cecum: Secondary | ICD-10-CM | POA: Diagnosis not present

## 2024-01-03 DIAGNOSIS — Z09 Encounter for follow-up examination after completed treatment for conditions other than malignant neoplasm: Secondary | ICD-10-CM | POA: Diagnosis not present

## 2024-01-10 DIAGNOSIS — K2930 Chronic superficial gastritis without bleeding: Secondary | ICD-10-CM | POA: Diagnosis not present

## 2024-01-10 DIAGNOSIS — K2289 Other specified disease of esophagus: Secondary | ICD-10-CM | POA: Diagnosis not present

## 2024-01-10 DIAGNOSIS — D120 Benign neoplasm of cecum: Secondary | ICD-10-CM | POA: Diagnosis not present

## 2024-01-23 ENCOUNTER — Ambulatory Visit
Admission: RE | Admit: 2024-01-23 | Discharge: 2024-01-23 | Disposition: A | Discharge status: Home / Self Care | Source: Ambulatory Visit | Attending: Internal Medicine | Admitting: Internal Medicine

## 2024-01-23 DIAGNOSIS — Z1231 Encounter for screening mammogram for malignant neoplasm of breast: Secondary | ICD-10-CM | POA: Diagnosis not present

## 2024-01-23 DIAGNOSIS — Z0000 Encounter for general adult medical examination without abnormal findings: Secondary | ICD-10-CM

## 2024-01-29 ENCOUNTER — Telehealth: Payer: Self-pay | Admitting: Cardiovascular Disease

## 2024-01-29 DIAGNOSIS — R072 Precordial pain: Secondary | ICD-10-CM

## 2024-01-29 NOTE — Telephone Encounter (Signed)
 Patient identification verified by 2 forms. Sandy Duck, RN     Called and spoke to patient  Patient states:  - Dr. Stann Earnest told her to call back if she still is having chest pain.  - experiencing chest pain infrequently. Pain is relieved on its own.  - last 3-5 minutes  - pain felt in lower left side of breast  - she walks daily and doesn't have the chest pain during this activity.   Patient denies:  - SOB, one-sided muscle weakness, blurred vision, N/V - daily blood pressure checks              Interventions/Plan: - Encounter forwarded to primary cardiologist and nurse for review and recommendations.    Reviewed ED warning signs/precautions  Patient agrees with plan, no questions at this time

## 2024-01-29 NOTE — Telephone Encounter (Signed)
 Pt called in asking to speak only to Dr. Francie Irani nurse. Did not wish to go into detail.

## 2024-01-31 ENCOUNTER — Other Ambulatory Visit (HOSPITAL_COMMUNITY): Payer: Self-pay

## 2024-01-31 MED ORDER — METOPROLOL TARTRATE 100 MG PO TABS
100.0000 mg | ORAL_TABLET | Freq: Once | ORAL | 0 refills | Status: AC
Start: 2024-01-31 — End: 2024-02-11
  Filled 2024-01-31: qty 1, 1d supply, fill #0

## 2024-01-31 NOTE — Telephone Encounter (Signed)
 Pt calling back

## 2024-01-31 NOTE — Telephone Encounter (Signed)
 Loyde Rule, MD  You; Zeb Heys, RN; [place] Due, RNYesterday (8:37 AM)   Has had normal myovue and echo 2024 can order cardiac CTA. Give 100 mg lopressor before test and check BMET first   Called patient to let her know of Dr. Francie Irani advisement. Patient will get CT closer to when school is out. Went over instructions. Will send through mychart and print out for patient.

## 2024-02-03 ENCOUNTER — Other Ambulatory Visit: Payer: Self-pay | Admitting: *Deleted

## 2024-02-03 DIAGNOSIS — R072 Precordial pain: Secondary | ICD-10-CM | POA: Diagnosis not present

## 2024-02-04 ENCOUNTER — Ambulatory Visit: Payer: Self-pay | Admitting: *Deleted

## 2024-02-04 LAB — BASIC METABOLIC PANEL WITH GFR
BUN/Creatinine Ratio: 20 (ref 12–28)
BUN: 21 mg/dL (ref 8–27)
CO2: 23 mmol/L (ref 20–29)
Calcium: 10 mg/dL (ref 8.7–10.3)
Chloride: 103 mmol/L (ref 96–106)
Creatinine, Ser: 1.03 mg/dL — ABNORMAL HIGH (ref 0.57–1.00)
Glucose: 106 mg/dL — ABNORMAL HIGH (ref 70–99)
Potassium: 4.3 mmol/L (ref 3.5–5.2)
Sodium: 141 mmol/L (ref 134–144)
eGFR: 57 mL/min/{1.73_m2} — ABNORMAL LOW (ref >59)

## 2024-02-07 ENCOUNTER — Telehealth: Payer: Self-pay | Admitting: Cardiovascular Disease

## 2024-02-07 NOTE — Telephone Encounter (Signed)
 Patient called to confirm where she should collect her package and wants a call back directly from RN Pam.

## 2024-02-07 NOTE — Telephone Encounter (Signed)
 Patient identification verified by 2 forms. Sims Duck, RN     Called and spoke to patient  Patient states:  - Pam was supposed to leave some paperwork at front desk for her but unsure if its on floor 5 or floor 1.   Patient denies:              Interventions/Plan: - Chart review unclear as to where documents were left. Will forward to nurse for F/U on Monday. Patient agrees with plan, no questions at this time  - Patient also requested BMET lab results from 5/12. Reviewed provider comments below.    Loyde Rule, MD 02/06/2024  9:36 PM EDT Back to Top    Lytes fine   Patient verbalized understanding. No further questions at this time.

## 2024-02-10 NOTE — Telephone Encounter (Signed)
 Called patient back to let her know her letter is still at check-in desk on the 5th floor and her medication can be picked up on 1st floor at the pharmacy.

## 2024-02-19 ENCOUNTER — Encounter (HOSPITAL_COMMUNITY): Payer: Self-pay

## 2024-02-21 ENCOUNTER — Ambulatory Visit (HOSPITAL_COMMUNITY)
Admission: RE | Admit: 2024-02-21 | Discharge: 2024-02-21 | Disposition: A | Discharge status: Home / Self Care | Source: Ambulatory Visit | Attending: Cardiology | Admitting: Cardiology

## 2024-02-21 DIAGNOSIS — R072 Precordial pain: Secondary | ICD-10-CM | POA: Diagnosis not present

## 2024-02-21 MED ORDER — IOHEXOL 350 MG/ML SOLN
100.0000 mL | Freq: Once | INTRAVENOUS | Status: AC | PRN
  Administered 2024-02-21: 100 mL via INTRAVENOUS

## 2024-02-21 MED ORDER — DILTIAZEM HCL 25 MG/5ML IV SOLN: 10.0000 mg | INTRAVENOUS | Status: DC | PRN

## 2024-02-21 MED ORDER — NITROGLYCERIN 0.4 MG SL SUBL
SUBLINGUAL_TABLET | SUBLINGUAL | Status: AC
  Filled 2024-02-21: qty 20

## 2024-02-21 MED ORDER — METOPROLOL TARTRATE 5 MG/5ML IV SOLN: 10.0000 mg | Freq: Once | INTRAVENOUS | Status: DC | PRN

## 2024-02-21 MED ORDER — NITROGLYCERIN 0.4 MG SL SUBL
0.8000 mg | SUBLINGUAL_TABLET | Freq: Once | SUBLINGUAL | Status: AC
  Administered 2024-02-21: 0.8 mg via SUBLINGUAL

## 2024-02-24 NOTE — Telephone Encounter (Addendum)
 Per Dr. Stann Earnest, normal cors great. Patient aware of results.

## 2024-03-11 DIAGNOSIS — R079 Chest pain, unspecified: Secondary | ICD-10-CM | POA: Diagnosis not present

## 2024-03-11 DIAGNOSIS — D560 Alpha thalassemia: Secondary | ICD-10-CM | POA: Diagnosis not present

## 2024-03-11 DIAGNOSIS — E1169 Type 2 diabetes mellitus with other specified complication: Secondary | ICD-10-CM | POA: Diagnosis not present

## 2024-05-26 NOTE — Progress Notes (Signed)
 CARDIOLOGY CONSULT NOTE       Patient ID: Sandy Bennett MRN: [id number] DOB/AGE: 74-12-1950 74 y.o.  Referring Physician: Patsey Pack ED Primary Physician: Rexanne Ingle, MD Primary Cardiologist: Delford   HPI:  73 y.o. referred by [facility] ER Dr Patsey for chest pain.First seen 01/22/23  History of obesity, HLD , HTN and MVP.  Seen in ED 01/21/23 Developed SSCP Saturday Constant pain. She walks 4x/week and pain is new. Associated with some dyspnea CXR hazy opacity in right lung base suspicious for infection Rx with Vibramycin . R/O negative troponin no acute ECG changes  ECG NSR minor nonspecific inferior T wave changes similar to 2019  Labs remarkable otherwise for suppressed WBC 2.8   Prior smoker quit in 85'   Myovue normal 01/23/23 EF 68% no ischemia Echo 01/23/23 EF 60-65% mild AR   Had a nice trip to [place] latter in May 2024 after good cardiac tests  Seen in ED again 09/25/23 for chest pain. Midline at rest No associated symptoms. R/O no acute ECG changes CXR NAD Given pepcid  and GI cocktail   Discussed fact that her echo and myovue were normal May 2024  Has been perfectly fine on protonix since ER visit Cardiac CTA done 02/21/24 was normal with calcium score of 0   Walking 4 miles/day with no symptoms   ROS All other systems reviewed and negative except as noted above  Past Medical History:  Diagnosis Date   Diverticulosis    Hyperlipidemia    Mild obesity    MVP (mitral valve prolapse)     Family History  Problem Relation Age of Onset   Cancer Mother        ? 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[employer]  Tobacco Use   Smoking status: Former    Current packs/day: 0.00    Average packs/day: 0.5 packs/day for 10.0 years (5.0 ttl pk-yrs)    Types: Cigarettes    Start date: 09/24/1973    Quit date: 09/25/1983    Years since quitting: 40.7   Smokeless tobacco: Never  Substance and Sexual Activity   Alcohol use: No   Drug use: No   Sexual activity: Not on file  Other Topics Concern   Not on file  Social History Narrative   Not on file   Social Drivers of Health   Financial Resource Strain: Not on file  Food Insecurity: Not on file  Transportation Needs: Not on file  Physical Activity: Not on file  Stress: Not on file  Social Connections: Not on file  Intimate Partner Violence: Not on file    Past Surgical History:  Procedure Laterality Date   CARPAL TUNNEL RELEASE  2010   rt   CHOLECYSTECTOMY  1994   PARTIAL HYSTERECTOMY  1970   TONSILLECTOMY        Current Outpatient Medications:    losartan-hydrochlorothiazide (HYZAAR) 50-12.5 MG tablet, Take 1 tablet by mouth daily., Disp: , Rfl:    NON FORMULARY, Take 1  capsule by mouth See admin instructions. [employer] Vitamin D3 + K2 with Coconut MCT Oil  Vegan D3 5000 IU with K2 100 mcg- Take 1 capsule by mouth once a day, Disp: , Rfl:    metoprolol  tartrate (LOPRESSOR ) 100 MG tablet, Take 1 tablet (100 mg total) by mouth once for 1 dose. Take 90-120 minutes prior to scan. Hold for SBP less than 110. (Patient not taking: Reported on 06/01/2024), Disp: 1 tablet, Rfl: 0   pantoprazole (PROTONIX) 40 MG tablet, Take 40 mg by mouth daily. (Patient not taking: Reported on 06/01/2024), Disp: , Rfl:     Physical Exam: Blood pressure (!) 142/70, pulse 72, height 5' 7.5 (1.715 m), weight 172 lb 9.6 oz (78.3 kg), SpO2 97%.    Affect appropriate Overweight black female  HEENT: normal Neck supple with no adenopathy JVP normal no bruits no thyromegaly Lungs clear with no wheezing and good diaphragmatic motion Heart:   S1/S2 no murmur, no rub, gallop or click PMI normal Abdomen: benign prior GB removal and hysterectomy  Distal pulses intact with no bruits No edema Neuro non-focal Skin warm and dry No muscular weakness prior right carpal tunnel surgery    Labs:   Lab Results  Component Value Date   WBC 3.1 (L) 09/25/2023   HGB 11.6 (L) 09/25/2023   HCT 39.0 09/25/2023   MCV 74.7 (L) 09/25/2023   PLT 199 09/25/2023    No results for input(s): NA, K, CL, CO2, BUN, CREATININE, CALCIUM, PROT, BILITOT, ALKPHOS, ALT, AST, GLUCOSE in the last 168 hours.  Invalid input(s): LABALBU  No results found for: CKTOTAL, CKMB, CKMBINDEX, TROPONINI  Lab Results  Component Value Date   CHOL 170 03/11/2018   CHOL 183 03/11/2017   CHOL 172 03/02/2016   Lab Results  Component Value Date   HDL 63.80 03/11/2018   HDL 59.70 03/11/2017   HDL 57.90 03/02/2016   Lab Results  Component Value Date   LDLCALC 90 03/11/2018   LDLCALC 110 (H) 03/11/2017   LDLCALC 103 (H) 03/02/2016   Lab Results  Component Value Date   TRIG 82.0 03/11/2018   TRIG 67.0 03/11/2017   TRIG 58.0 03/02/2016   Lab Results  Component Value Date   CHOLHDL 3 03/11/2018   CHOLHDL 3 03/11/2017   CHOLHDL 3 03/02/2016   No results found for: LDLDIRECT    Radiology: No results found.  EKG: See HPI   ASSESSMENT AND PLAN:   Chest pain:  atypical Normal myovue and echo 01/23/23 Recurrent pain requiring ER visit She has not had any recurrence since ER on protonix Cardiac CTA 02/21/24 normal with calcium score 0- non cardiac pain stable HTN:  Well controlled.  Continue current medications and low sodium Dash type diet.   HLD:  not on meds f/u with primary for labs and to discuss  F/U  in a year  Signed: Maude Emmer 06/01/2024, 8:01 AM

## 2024-06-01 ENCOUNTER — Encounter: Payer: Self-pay | Admitting: Cardiovascular Disease

## 2024-06-01 ENCOUNTER — Ambulatory Visit: Attending: Cardiovascular Disease | Admitting: Cardiovascular Disease

## 2024-06-01 VITALS — BP 142/70 | HR 72 | Ht 67.5 in | Wt 172.6 lb

## 2024-06-01 DIAGNOSIS — R079 Chest pain, unspecified: Secondary | ICD-10-CM

## 2024-06-01 DIAGNOSIS — I10 Essential (primary) hypertension: Secondary | ICD-10-CM | POA: Diagnosis not present

## 2024-06-01 NOTE — Patient Instructions (Signed)
 Medication Instructions:  Your physician recommends that you continue on your current medications as directed. Please refer to the Current Medication list given to you today.  *If you need a refill on your cardiac medications before your next appointment, please call your pharmacy*  Follow-Up: At [facility], you and your health needs are our priority.  As part of our continuing mission to provide you with exceptional heart care, our providers are all part of one team.  This team includes your primary Cardiologist (physician) and Advanced Practice Providers or APPs (Physician Assistants and Nurse Practitioners) who all work together to provide you with the care you need, when you need it.  Your next appointment:   1 year  Provider:   Maude Emmer, MD

## 2024-06-03 DIAGNOSIS — G479 Sleep disorder, unspecified: Secondary | ICD-10-CM | POA: Diagnosis not present

## 2024-06-03 DIAGNOSIS — Z1331 Encounter for screening for depression: Secondary | ICD-10-CM | POA: Diagnosis not present

## 2024-06-03 DIAGNOSIS — E559 Vitamin D deficiency, unspecified: Secondary | ICD-10-CM | POA: Diagnosis not present

## 2024-06-03 DIAGNOSIS — E7800 Pure hypercholesterolemia, unspecified: Secondary | ICD-10-CM | POA: Diagnosis not present

## 2024-06-03 DIAGNOSIS — I10 Essential (primary) hypertension: Secondary | ICD-10-CM | POA: Diagnosis not present

## 2024-06-03 DIAGNOSIS — Z0000 Encounter for general adult medical examination without abnormal findings: Secondary | ICD-10-CM | POA: Diagnosis not present

## 2024-06-03 DIAGNOSIS — Z23 Encounter for immunization: Secondary | ICD-10-CM | POA: Diagnosis not present

## 2024-06-03 DIAGNOSIS — E1169 Type 2 diabetes mellitus with other specified complication: Secondary | ICD-10-CM | POA: Diagnosis not present

## 2024-06-22 ENCOUNTER — Other Ambulatory Visit: Payer: Self-pay

## 2024-06-22 ENCOUNTER — Ambulatory Visit: Admitting: Orthopaedic Surgery

## 2024-06-22 DIAGNOSIS — G8929 Other chronic pain: Secondary | ICD-10-CM

## 2024-06-22 DIAGNOSIS — M25511 Pain in right shoulder: Secondary | ICD-10-CM

## 2024-06-22 MED ORDER — LIDOCAINE HCL 1 % IJ SOLN
3.0000 mL | INTRAMUSCULAR | Status: AC | PRN
  Administered 2024-06-22: 3 mL

## 2024-06-22 MED ORDER — METHYLPREDNISOLONE ACETATE 40 MG/ML IJ SUSP
40.0000 mg | INTRAMUSCULAR | Status: AC | PRN
  Administered 2024-06-22: 40 mg via INTRA_ARTICULAR

## 2024-06-22 NOTE — Progress Notes (Addendum)
 The patient is a 73 year old female that we have seen for her right shoulder back in 2019.  She states she has a history of right rotator cuff surgery done years ago and she cannot even remember who did that type of surgery.  She says she has pain with overhead activities and reaching behind her and mainly pain at night but not every night.  She denies any injury with that right shoulder.  On exam the right shoulder does moving fluidly with just some weakness with abduction and some loss of abduction but not significant.  She is able to reach behind her easily and out to the side and has good rotation.  The shoulder is well located.  There is no significant grinding at the glenohumeral joint.  3 views of the right shoulder are seen today and compared to films from 2018 and showed no significant changes.  There is a superior lateral acromial bone spur but this has not changed.  It has been 6 years since she had a steroid injection of the right shoulder subacromial outlet and she agreed to have this today and I think this is reasonable to also try.  She tolerated the injection well.  All questions and concerns were answered and addressed.  Follow-up can be as needed.    Procedure Note  Patient: Sandy Bennett             Date of Birth: 1951-07-14           MRN: [id number]             Visit Date: 06/22/2024  Procedures: Visit Diagnoses:  1. Chronic right shoulder pain     Large Joint Inj: R subacromial bursa on 06/22/2024 9:02 AM Indications: pain and diagnostic evaluation Details: 22 G 1.5 in needle  Arthrogram: No  Medications: 3 mL lidocaine  1 %; 40 mg methylPREDNISolone  acetate 40 MG/ML Outcome: tolerated well, no immediate complications Procedure, treatment alternatives, risks and benefits explained, specific risks discussed. Consent was given by the patient. Immediately prior to procedure a time out was called to verify the correct patient, procedure, equipment, support staff and  site/side marked as required. Patient was prepped and draped in the usual sterile fashion.

## 2024-06-27 DIAGNOSIS — Z23 Encounter for immunization: Secondary | ICD-10-CM | POA: Diagnosis not present

## 2024-07-27 ENCOUNTER — Encounter: Payer: Self-pay | Admitting: Radiology

## 2024-10-14 ENCOUNTER — Ambulatory Visit: Admitting: Orthopaedic Surgery

## 2024-10-14 ENCOUNTER — Encounter: Payer: Self-pay | Admitting: Orthopaedic Surgery

## 2024-10-14 DIAGNOSIS — G8929 Other chronic pain: Secondary | ICD-10-CM

## 2024-10-14 DIAGNOSIS — M25511 Pain in right shoulder: Secondary | ICD-10-CM

## 2024-10-14 MED ORDER — METHYLPREDNISOLONE 4 MG PO TABS: ORAL_TABLET | ORAL | 0 refills | Status: AC

## 2024-10-14 NOTE — Progress Notes (Signed)
 The patient is a 74 year old female that we saw almost 4 months ago for her right shoulder.  She was having significant shoulder pain and had a remote history of a rotator cuff surgery that was done many years ago by someone else.  She had not had a steroid injection about 6 years so we did place a steroid injection in the shoulder and she says it only lasted for about a month.  When she made this appointment she said her shoulder was hurting quite a bit but she says is not as bad now.  She is using topical pain reliever as well as heat on the shoulder.  She says is not painful enough to consider steroid injection.  She is not a diabetic.  On exam the shoulder is moving fluidly on the right shoulder but there is definitely some signs of impingement.  There is no a lot of weakness of the rotator cuff but there is some present.  She does show positive Neer and Hawkins signs.  We talked about the possibility physical therapy on her shoulder but she would like to try something medically so I will send in a steroid taper to see if this will help her.  She would like to try that.  If this does not help she will contact us  again to consider therapy in the shoulder or repeat injection.  We may end up needing to MRI of the shoulder if she continues to have issues and go from there for assessing whether or not there would be a surgical intervention required but hopefully we can continue conservative treatment for now.  She agrees with this treatment plan.
